# Patient Record
Sex: Female | Born: 1960
Health system: Southern US, Community
[De-identification: ages and names within clinical notes are randomized; demographics above are authoritative.]

## PROBLEM LIST (undated history)

## (undated) DIAGNOSIS — M1990 Unspecified osteoarthritis, unspecified site: Secondary | ICD-10-CM

## (undated) DIAGNOSIS — F419 Anxiety disorder, unspecified: Secondary | ICD-10-CM

## (undated) DIAGNOSIS — I10 Essential (primary) hypertension: Secondary | ICD-10-CM

## (undated) HISTORY — DX: Essential (primary) hypertension: I10

## (undated) HISTORY — DX: Unspecified osteoarthritis, unspecified site: M19.90

## (undated) HISTORY — DX: Anxiety disorder, unspecified: F41.9

## (undated) HISTORY — PX: OTHER SURGICAL HISTORY: SHX169

---

## 2012-11-21 HISTORY — PX: LUMBAR FUSION: SHX111

## 2014-11-21 HISTORY — PX: ABDOMINAL HYSTERECTOMY: SHX81

## 2019-11-13 ENCOUNTER — Encounter: Payer: Self-pay | Admitting: Gastroenterology

## 2019-11-27 ENCOUNTER — Encounter (INDEPENDENT_AMBULATORY_CARE_PROVIDER_SITE_OTHER): Payer: Self-pay

## 2019-11-27 ENCOUNTER — Other Ambulatory Visit: Payer: Self-pay

## 2019-11-27 ENCOUNTER — Ambulatory Visit (AMBULATORY_SURGERY_CENTER): Payer: BC Managed Care – PPO | Admitting: *Deleted

## 2019-11-27 VITALS — Temp 97.1°F | Ht 63.5 in | Wt 130.0 lb

## 2019-11-27 DIAGNOSIS — Z1159 Encounter for screening for other viral diseases: Secondary | ICD-10-CM

## 2019-11-27 DIAGNOSIS — Z1211 Encounter for screening for malignant neoplasm of colon: Secondary | ICD-10-CM

## 2019-11-27 MED ORDER — SUPREP BOWEL PREP KIT 17.5-3.13-1.6 GM/177ML PO SOLN: 1.0000 | Freq: Once | ORAL | 0 refills | Status: AC

## 2019-11-27 NOTE — Progress Notes (Signed)
No egg or soy allergy known to patient  No issues with past sedation with any surgeries  or procedures, no intubation problems  No diet pills per patient No home 02 use per patient  No blood thinners per patient  Pt denies issues with constipation  No A fib or A flutter  EMMI video sent to pt's e mail  Suprep $15   Due to the COVID-19 pandemic we are asking patients to follow these guidelines. Please only bring one care partner. Please be aware that your care partner may wait in the car in the parking lot or if they feel like they will be too hot to wait in the car, they may wait in the lobby on the 4th floor. All care partners are required to wear a mask the entire time (we do not have any that we can provide them), they need to practice social distancing, and we will do a Covid check for all patient's and care partners when you arrive. Also we will check their temperature and your temperature. If the care partner waits in their car they need to stay in the parking lot the entire time and we will call them on their cell phone when the patient is ready for discharge so they can bring the car to the front of the building. Also all patient's will need to wear a mask into building.

## 2019-12-04 ENCOUNTER — Encounter: Payer: Self-pay | Admitting: Gastroenterology

## 2019-12-06 ENCOUNTER — Ambulatory Visit (INDEPENDENT_AMBULATORY_CARE_PROVIDER_SITE_OTHER): Payer: BC Managed Care – PPO

## 2019-12-06 ENCOUNTER — Other Ambulatory Visit: Payer: Self-pay | Admitting: Gastroenterology

## 2019-12-06 DIAGNOSIS — Z1159 Encounter for screening for other viral diseases: Secondary | ICD-10-CM

## 2019-12-09 LAB — SARS CORONAVIRUS 2 (TAT 6-24 HRS): SARS Coronavirus 2: NEGATIVE

## 2019-12-11 ENCOUNTER — Encounter: Payer: Self-pay | Admitting: Gastroenterology

## 2019-12-11 ENCOUNTER — Ambulatory Visit (AMBULATORY_SURGERY_CENTER): Payer: BC Managed Care – PPO | Admitting: Gastroenterology

## 2019-12-11 ENCOUNTER — Other Ambulatory Visit: Payer: Self-pay

## 2019-12-11 VITALS — BP 122/71 | HR 70 | Temp 98.3°F | Resp 14 | Ht 63.5 in | Wt 130.0 lb

## 2019-12-11 DIAGNOSIS — Z1211 Encounter for screening for malignant neoplasm of colon: Secondary | ICD-10-CM

## 2019-12-11 DIAGNOSIS — D120 Benign neoplasm of cecum: Secondary | ICD-10-CM

## 2019-12-11 MED ORDER — HYDROCORTISONE (PERIANAL) 2.5 % EX CREA: 1.0000 "application " | TOPICAL_CREAM | Freq: Two times a day (BID) | CUTANEOUS | 2 refills | Status: AC | PRN

## 2019-12-11 MED ORDER — SODIUM CHLORIDE 0.9 % IV SOLN: 500.0000 mL | Freq: Once | INTRAVENOUS | Status: DC

## 2019-12-11 NOTE — Progress Notes (Signed)
Temp  LC  VS  DT  Pt's states no medical or surgical changes since previsit or office visit.    

## 2019-12-11 NOTE — Patient Instructions (Signed)
YOU HAD AN ENDOSCOPIC PROCEDURE TODAY AT [facility]:   Refer to the procedure report that was given to you for any specific questions about what was found during the examination.  If the procedure report does not answer your questions, please call your gastroenterologist to clarify.  If you requested that your care partner not be given the details of your procedure findings, then the procedure report has been included in a sealed envelope for you to review at your convenience later.  **Handouts given on polyps, hemorrhoids and diverticulosis**  YOU SHOULD EXPECT: Some feelings of bloating in the abdomen. Passage of more gas than usual.  Walking can help get rid of the air that was put into your GI tract during the procedure and reduce the bloating. If you had a lower endoscopy (such as a colonoscopy or flexible sigmoidoscopy) you may notice spotting of blood in your stool or on the toilet paper. If you underwent a bowel prep for your procedure, you may not have a normal bowel movement for a few days.  Please Note:  You might notice some irritation and congestion in your nose or some drainage.  This is from the oxygen used during your procedure.  There is no need for concern and it should clear up in a day or so.  SYMPTOMS TO REPORT IMMEDIATELY:   Following lower endoscopy (colonoscopy or flexible sigmoidoscopy):  Excessive amounts of blood in the stool  Significant tenderness or worsening of abdominal pains  Swelling of the abdomen that is new, acute  Fever of 100F or higher    For urgent or emergent issues, a gastroenterologist can be reached at any hour by calling [phone].   DIET:  We do recommend a small meal at first, but then you may proceed to your regular diet.  Drink plenty of fluids but you should avoid alcoholic beverages for 24 hours.  ACTIVITY:  You should plan to take it easy for the rest of today and you should NOT DRIVE or use heavy machinery until  tomorrow (because of the sedation medicines used during the test).    FOLLOW UP: Our staff will call the number listed on your records 48-72 hours following your procedure to check on you and address any questions or concerns that you may have regarding the information given to you following your procedure. If we do not reach you, we will leave a message.  We will attempt to reach you two times.  During this call, we will ask if you have developed any symptoms of COVID 19. If you develop any symptoms (ie: fever, flu-like symptoms, shortness of breath, cough etc.) before then, please call [phone].  If you test positive for Covid 19 in the 2 weeks post procedure, please call and report this information to [place].    If any biopsies were taken you will be contacted by phone or by letter within the next 1-3 weeks.  Please call us at [phone] if you have not heard about the biopsies in 3 weeks.    SIGNATURES/CONFIDENTIALITY: You and/or your care partner have signed paperwork which will be entered into your electronic medical record.  These signatures attest to the fact that that the information above on your After Visit Summary has been reviewed and is understood.  Full responsibility of the confidentiality of this discharge information lies with you and/or your care-partner.

## 2019-12-11 NOTE — Progress Notes (Signed)
Called to room to assist during endoscopic procedure.  Patient ID and intended procedure confirmed with present staff. Received instructions for my participation in the procedure from the performing physician.  

## 2019-12-11 NOTE — Op Note (Signed)
[facility] Patient Name: Renee Reeves Procedure Date: 12/11/2019 9:30 AM MRN: [id number] Endoscopist: Jackquline Denmark , MD Age: 59 Referring MD:  Date of Birth: Jan 30, 1961 Gender: Female Account #: [id number] Procedure:                Colonoscopy Indications:              Screening for colorectal malignant neoplasm Medicines:                Monitored Anesthesia Care Procedure:                Pre-Anesthesia Assessment:                           - Prior to the procedure, a History and Physical                            was performed, and patient medications and                            allergies were reviewed. The patient's tolerance of                            previous anesthesia was also reviewed. The risks                            and benefits of the procedure and the sedation                            options and risks were discussed with the patient.                            All questions were answered, and informed consent                            was obtained. Prior Anticoagulants: The patient has                            taken no previous anticoagulant or antiplatelet                            agents. ASA Grade Assessment: II - A patient with                            mild systemic disease. After reviewing the risks                            and benefits, the patient was deemed in                            satisfactory condition to undergo the procedure.                           After obtaining informed consent, the colonoscope  was passed under direct vision. Throughout the                            procedure, the patient's blood pressure, pulse, and                            oxygen saturations were monitored continuously. The                            Colonoscope was introduced through the anus and                            advanced to the 2 cm into the ileum. The                            colonoscopy was performed  without difficulty. The                            patient tolerated the procedure well. The quality                            of the bowel preparation was good. The terminal                            ileum, ileocecal valve, appendiceal orifice, and                            rectum were photographed. Scope In: 9:37:21 AM Scope Out: 9:56:55 AM Scope Withdrawal Time: 0 hours 13 minutes 20 seconds  Total Procedure Duration: 0 hours 19 minutes 34 seconds  Findings:                 A 10 mm polyp was found in the cecum. The polyp was                            sessile. The polyp was removed with a hot snare.                            Resection and retrieval were complete. Estimated                            blood loss: none.                           A few small-mouthed diverticula were found in the                            sigmoid colon and ascending colon.                           Non-bleeding internal hemorrhoids were found during                            retroflexion. The hemorrhoids were small.  The terminal ileum appeared normal.                           The exam was otherwise without abnormality on                            direct and retroflexion views. Complications:            No immediate complications. Estimated Blood Loss:     Estimated blood loss: none. Impression:               -Colonic polyp s/p polypectomy.                           -Mild pancolonic diverticulosis                           -Non-bleeding internal hemorrhoids.                           -Otherwise normal colonoscopy to TI. Recommendation:           - Patient has a contact number available for                            emergencies. The signs and symptoms of potential                            delayed complications were discussed with the                            patient. Return to normal activities tomorrow.                            Written discharge instructions were  provided to the                            patient.                           - Resume previous diet.                           - Continue present medications.                           - Await pathology results.                           - Repeat colonoscopy for surveillance based on                            pathology results.                           - HC 2.5% cream PR bid prn for hemorrhoids x 10  days, 2 refills.                           - No aspirin, ibuprofen, naproxen, or other                            non-steroidal anti-inflammatory drugs for 5 days                            after polyp removal.                           - The findings and recommendations were discussed                            with the designated responsible adult. Jackquline Denmark, MD 12/11/2019 10:02:35 AM This report has been signed electronically.

## 2019-12-11 NOTE — Progress Notes (Signed)
Pt tolerated well. VSS. Awake and to recovery. 

## 2019-12-13 ENCOUNTER — Telehealth: Payer: Self-pay | Admitting: *Deleted

## 2019-12-13 NOTE — Telephone Encounter (Signed)
  Follow up Call-  Call back number 12/11/2019  Post procedure Call Back phone  # [phone]  Permission to leave phone message Yes     Patient questions:  Do you have a fever, pain , or abdominal swelling? No. Pain Score  0 *  Have you tolerated food without any problems? Yes.    Have you been able to return to your normal activities? Yes.    Do you have any questions about your discharge instructions: Diet   No. Medications  No. Follow up visit  No.  Do you have questions or concerns about your Care? No.  Actions: * If pain score is 4 or above: No action needed, pain <4.   1. Have you developed a fever since your procedure? no  2.   Have you had an respiratory symptoms (SOB or cough) since your procedure? no  3.   Have you tested positive for COVID 19 since your procedure no  4.   Have you had any family members/close contacts diagnosed with the COVID 19 since your procedure?  no   If yes to any of these questions please route to Joylene John, RN and Alphonsa Gin, Therapist, sports.

## 2019-12-17 ENCOUNTER — Encounter: Payer: Self-pay | Admitting: Gastroenterology

## 2020-04-17 ENCOUNTER — Telehealth: Payer: Self-pay | Admitting: Gastroenterology

## 2020-04-17 NOTE — Telephone Encounter (Signed)
Hi Renee Reeves, Renee Reeves's PCP just called requesting Renee Reeves to be seen sooner as her scheduled appt with Dr. Lyndel Safe on 6/24. They stated that Renee Reeves has been experiencing diarrhea for the past 3 weeks, they did several stool cultures that came back negative. Renee Reeves has been taking imodium and probiotics but nothing has helped. Renee Reeves can be contacted directly for appt.

## 2020-04-17 NOTE — Telephone Encounter (Signed)
Spoke with patient.  Advised of Dr Leland Her instructions.  Pt is very concerned and worried.  She states all of this began after her covid.  Pt states she has a low grade fever and feels lightheaded at times.  I told her if she begins to feel worse after trying what Dr Lyndel Safe has instructed then go to the ED.

## 2020-04-17 NOTE — Telephone Encounter (Signed)
NP Lets give her a trial of Imodium AD 1 tablet p.o. 3 times daily with meals x 1 week, then as needed. According to the note her stool studies were negative Start probiotics 1/day x 2 weeks. Let us know how she is next week  RG

## 2020-04-27 ENCOUNTER — Telehealth: Payer: Self-pay | Admitting: Gastroenterology

## 2020-04-27 NOTE — Telephone Encounter (Signed)
Called patient back regarding continued diarrhea.  Per Dr. Bryan Lemma try Benefiber or Citrucel to bulk up stools. Try eating a high fiber diet and continue Imodium as needed.  Patient verbalized understanding.

## 2020-05-05 ENCOUNTER — Encounter: Payer: Self-pay | Admitting: Gastroenterology

## 2020-05-05 ENCOUNTER — Ambulatory Visit: Payer: BC Managed Care – PPO | Admitting: Gastroenterology

## 2020-05-05 ENCOUNTER — Other Ambulatory Visit: Payer: Self-pay

## 2020-05-05 ENCOUNTER — Emergency Department (HOSPITAL_BASED_OUTPATIENT_CLINIC_OR_DEPARTMENT_OTHER)
Admission: EM | Admit: 2020-05-05 | Discharge: 2020-05-05 | Disposition: A | Discharge status: Home / Self Care | Payer: BC Managed Care – PPO | Attending: Emergency Medicine | Admitting: Emergency Medicine

## 2020-05-05 ENCOUNTER — Encounter (HOSPITAL_BASED_OUTPATIENT_CLINIC_OR_DEPARTMENT_OTHER): Payer: Self-pay

## 2020-05-05 VITALS — BP 92/58 | HR 90 | Temp 97.8°F | Ht 63.5 in | Wt 115.1 lb

## 2020-05-05 DIAGNOSIS — R197 Diarrhea, unspecified: Secondary | ICD-10-CM | POA: Diagnosis not present

## 2020-05-05 DIAGNOSIS — I10 Essential (primary) hypertension: Secondary | ICD-10-CM | POA: Diagnosis not present

## 2020-05-05 DIAGNOSIS — R079 Chest pain, unspecified: Secondary | ICD-10-CM | POA: Insufficient documentation

## 2020-05-05 DIAGNOSIS — E876 Hypokalemia: Secondary | ICD-10-CM | POA: Diagnosis not present

## 2020-05-05 DIAGNOSIS — Z880 Allergy status to penicillin: Secondary | ICD-10-CM | POA: Diagnosis not present

## 2020-05-05 DIAGNOSIS — R531 Weakness: Secondary | ICD-10-CM | POA: Diagnosis present

## 2020-05-05 DIAGNOSIS — R630 Anorexia: Secondary | ICD-10-CM | POA: Insufficient documentation

## 2020-05-05 DIAGNOSIS — R109 Unspecified abdominal pain: Secondary | ICD-10-CM | POA: Insufficient documentation

## 2020-05-05 DIAGNOSIS — R5383 Other fatigue: Secondary | ICD-10-CM | POA: Diagnosis not present

## 2020-05-05 DIAGNOSIS — E860 Dehydration: Secondary | ICD-10-CM | POA: Insufficient documentation

## 2020-05-05 DIAGNOSIS — R110 Nausea: Secondary | ICD-10-CM | POA: Diagnosis not present

## 2020-05-05 DIAGNOSIS — R1310 Dysphagia, unspecified: Secondary | ICD-10-CM

## 2020-05-05 DIAGNOSIS — R1084 Generalized abdominal pain: Secondary | ICD-10-CM

## 2020-05-05 DIAGNOSIS — M549 Dorsalgia, unspecified: Secondary | ICD-10-CM | POA: Diagnosis not present

## 2020-05-05 DIAGNOSIS — R112 Nausea with vomiting, unspecified: Secondary | ICD-10-CM

## 2020-05-05 DIAGNOSIS — Z87891 Personal history of nicotine dependence: Secondary | ICD-10-CM | POA: Diagnosis not present

## 2020-05-05 LAB — CBC WITH DIFFERENTIAL/PLATELET
Abs Immature Granulocytes: 0.05 10*3/uL (ref 0.00–0.07)
Basophils Absolute: 0.1 10*3/uL (ref 0.0–0.1)
Basophils Relative: 1 %
Eosinophils Absolute: 0 10*3/uL (ref 0.0–0.5)
Eosinophils Relative: 0 %
HCT: 39.9 % (ref 36.0–46.0)
Hemoglobin: 13.1 g/dL (ref 12.0–15.0)
Immature Granulocytes: 1 %
Lymphocytes Relative: 25 %
Lymphs Abs: 1.8 10*3/uL (ref 0.7–4.0)
MCH: 28.5 pg (ref 26.0–34.0)
MCHC: 32.8 g/dL (ref 30.0–36.0)
MCV: 86.9 fL (ref 80.0–100.0)
Monocytes Absolute: 1.2 10*3/uL — ABNORMAL HIGH (ref 0.1–1.0)
Monocytes Relative: 17 %
Neutro Abs: 4.1 10*3/uL (ref 1.7–7.7)
Neutrophils Relative %: 56 %
Platelets: 433 10*3/uL — ABNORMAL HIGH (ref 150–400)
RBC: 4.59 MIL/uL (ref 3.87–5.11)
RDW: 12.7 % (ref 11.5–15.5)
WBC: 7.3 10*3/uL (ref 4.0–10.5)
nRBC: 0 % (ref 0.0–0.2)

## 2020-05-05 LAB — COMPREHENSIVE METABOLIC PANEL
ALT: 14 U/L (ref 0–44)
AST: 19 U/L (ref 15–41)
Albumin: 3.6 g/dL (ref 3.5–5.0)
Alkaline Phosphatase: 73 U/L (ref 38–126)
Anion gap: 18 — ABNORMAL HIGH (ref 5–15)
BUN: 24 mg/dL — ABNORMAL HIGH (ref 6–20)
CO2: 26 mmol/L (ref 22–32)
Calcium: 8.3 mg/dL — ABNORMAL LOW (ref 8.9–10.3)
Chloride: 91 mmol/L — ABNORMAL LOW (ref 98–111)
Creatinine, Ser: 1.98 mg/dL — ABNORMAL HIGH (ref 0.44–1.00)
GFR calc Af Amer: 32 mL/min — ABNORMAL LOW (ref >60)
GFR calc non Af Amer: 27 mL/min — ABNORMAL LOW (ref >60)
Glucose, Bld: 94 mg/dL (ref 70–99)
Potassium: 3 mmol/L — ABNORMAL LOW (ref 3.5–5.1)
Sodium: 135 mmol/L (ref 135–145)
Total Bilirubin: 0.7 mg/dL (ref 0.3–1.2)
Total Protein: 7 g/dL (ref 6.5–8.1)

## 2020-05-05 LAB — C-REACTIVE PROTEIN: CRP: 7.9 mg/dL — ABNORMAL HIGH (ref <1.0)

## 2020-05-05 LAB — C DIFFICILE QUICK SCREEN W PCR REFLEX
C Diff antigen: NEGATIVE
C Diff interpretation: NOT DETECTED
C Diff toxin: NEGATIVE

## 2020-05-05 LAB — SEDIMENTATION RATE: Sed Rate: 53 mm/hr — ABNORMAL HIGH (ref 0–22)

## 2020-05-05 MED ORDER — SODIUM CHLORIDE 0.9 % IV SOLN
INTRAVENOUS | Status: DC | PRN
  Administered 2020-05-05: 250 mL via INTRAVENOUS

## 2020-05-05 MED ORDER — POTASSIUM CHLORIDE ER 10 MEQ PO TBCR
10.0000 meq | EXTENDED_RELEASE_TABLET | Freq: Two times a day (BID) | ORAL | 0 refills | Status: DC
Start: 2020-05-05 — End: 2022-04-15

## 2020-05-05 MED ORDER — LACTATED RINGERS IV BOLUS
1000.0000 mL | Freq: Once | INTRAVENOUS | Status: AC
  Administered 2020-05-05: 1000 mL via INTRAVENOUS

## 2020-05-05 MED ORDER — POTASSIUM CHLORIDE 10 MEQ/100ML IV SOLN
10.0000 meq | Freq: Once | INTRAVENOUS | Status: AC
  Administered 2020-05-05: 10 meq via INTRAVENOUS
  Filled 2020-05-05: qty 100

## 2020-05-05 MED ORDER — DICYCLOMINE HCL 10 MG PO CAPS: 10.0000 mg | ORAL_CAPSULE | Freq: Three times a day (TID) | ORAL | 0 refills | Status: DC

## 2020-05-05 NOTE — Progress Notes (Signed)
Chief Complaint:   Referring Provider:  Myrlene Broker, MD      ASSESSMENT AND PLAN;   #1. N/V/dysphagia  #2. Gen abdo pain  #3. Diarrhea. Neg colon to TI except for colonic polyp Jan 2021. CT AP 04/2020 showing dilated small bowel and colonic loops without obstruction. Neg stool studies for C. Difficile.  No response to empiric Flagyl.  No response to Lomotil/Imodium. Not keen on stopping Leflunomide which can cause diarrhea.  Plan: -Please obtain previous records  -Send to ED downstairs- IVF, labs, further eval -CBC, CMP, CRP, sed rate -Stool studies for GI Pathogen (includes C. Diff), WBCs, culture,O&P, fecal elastase, fat and Calprotectin. - Depending upon above-I think we should go ahead with UGI series with SBFT (with Ba tab as well) followed by EGD with esophageal dilatation if needed. -Also would recommend trial of Bentyl -Continue Zofran for now. -Await ED visit results. -Push PO fluids. -Discussed extensively with the patient and patient's husband. -CT report from 04/21/2020 was reviewed.   HPI:    Renee Reeves is a 59 y.o. 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  Got progressively worse.  Seen by Dr. Unk Lightning.  Had negative stool studies.  Treated empirically with metronidazole x 1 week without any benefit.  Seen in ED on 04/21/2020.  Afebrile, vitals were stable including blood pressure.  Had normal CBC with hemoglobin 12.4, WBC count 5.1, BMP with BUN 11/creatinine 0.6.  Underwent CT Abdo/pelvis with contrast showing " multiple nondilated fluid-filled large and small bowel loops throughout the abdomen.  No obstruction.  Mild intrahepatic biliary ductal dilatation-nonspecific.  Correlate with LFTs.".  I did not see LFTs in ED note per se.  Lab reports are awaited.  Stool studies were sent from ED.  According to the notes patient was negative for C. difficile.  It was suggested to start Cipro  plus Flagyl.  Add Lomotil as needed.  And make follow-up appointment with GI and rheumatology.  She did not take Cipro or Flagyl.  She did take Lomotil which made her symptoms worse.  Currently having diarrhea 2-3 times per day, after eating, no nocturnal symptoms.  Has some urgency.  Crampy abdominal pain which gets better with defecation.  Also abdominal bloating which gets better with defecation.  -C/O nausea/vomiting x similar duration.  Mostly happens with solids.  She also has been complaining of dysphagia without odynophagia with food especially pills getting hung up in the mid to upper chest.  She told me that she cannot swallow any pills or eat food.  Not having any significant heartburn.  Has Zofran which helps a little.  Feels dizzy lightheaded and would like to have IV fluids.  - C/O generalized abdominal pain with abdominal bloating-moderate to severe, crampy, gets better with bowel movements.  Gets worse after eating.  -Lost 15lb over 1 month.  Had subjective fevers/chills but no fever x last 2 nights. Drinking ensure   Has seen rheumatology for rheumatoid arthritis.  She has been recommended to go off [place].  But she is very reluctant to do that.    Has taken herself off Celebrex.  However, that is resulted in generalized back pain as well.  Does admit that anxiety has some role to play.  She wanted to record the conversation.  I have let her.   Past GI procedures: -Colonoscopy 12/11/2019 (part of colorectal cancer screening).  Advanced to TI.  1 cm tubular adenoma s/p polypectomy, mild pancolonic diverticulosis.  Otherwise normal colonoscopy.  Repeat in 3 years.  Past Medical History:  Diagnosis Date  . Anxiety    OCC  . Arthritis    RA  . Hypertension     Past Surgical History:  Procedure Laterality Date  . ABDOMINAL HYSTERECTOMY  2016  . gynecological procedure    . LUMBAR FUSION  2014    Family History  Problem Relation Age of Onset  . Colon polyps Neg Hx    . Colon cancer Neg Hx   . Esophageal cancer Neg Hx   . Rectal cancer Neg Hx   . Stomach cancer Neg Hx   . Pancreatic cancer Neg Hx     Social History   Tobacco Use  . Smoking status: Former Research scientist (life sciences)  . Smokeless tobacco: Never Used  . Tobacco comment: stopped age 34   Vaping Use  . Vaping Use: Never used  Substance Use Topics  . Alcohol use: Yes    Comment: occ wine   . Drug use: Never    Current Outpatient Medications  Medication Sig Dispense Refill  . acetaminophen (TYLENOL) 500 MG tablet Take 500 mg by mouth every 6 (six) hours as needed. (Patient not taking: Reported on 05/05/2020)    . ALPRAZolam (XANAX) 1 MG tablet alprazolam 1 mg tablet (Patient not taking: Reported on 05/05/2020)    . amLODipine-valsartan (EXFORGE) 10-320 MG tablet amlodipine 10 mg-valsartan 320 mg tablet (Patient not taking: Reported on 05/05/2020)    . Calcium Carb-Cholecalciferol 500-400 MG-UNIT CHEW Chew by mouth. (Patient not taking: Reported on 05/05/2020)    . celecoxib (CELEBREX) 200 MG capsule SMARTSIG:1 Capsule(s) By Mouth 1 to 2 Times Daily (Patient not taking: Reported on 05/05/2020)    . Cholecalciferol 1.25 MG (50000 UT) TABS Take by mouth. (Patient not taking: Reported on 05/05/2020)    . famotidine (PEPCID) 40 MG tablet Take by mouth. (Patient not taking: Reported on 05/05/2020)    . folic acid (FOLVITE) 1 MG tablet folic acid 1 mg tablet (Patient not taking: Reported on 05/05/2020)    . leflunomide (ARAVA) 20 MG tablet leflunomide 20 mg tablet (Patient not taking: Reported on 05/05/2020)    . tiZANidine (ZANAFLEX) 4 MG tablet Take by mouth. (Patient not taking: Reported on 05/05/2020)    . valACYclovir (VALTREX) 500 MG tablet valacyclovir 500 mg tablet (Patient not taking: Reported on 05/05/2020)     No current facility-administered medications for this visit.    Allergies  Allergen Reactions  . Hydrocodone Itching  . Lisinopril Itching and Rash  . Penicillins Itching and Other (See Comments)      As a child As a child     Review of Systems:  Constitutional: has fever, chills, diaphoresis, has appetite change and extreme fatigue.  HEENT: Denies photophobia, eye pain, redness, hearing loss, ear pain, congestion, sore throat, rhinorrhea, sneezing, mouth sores, neck pain, neck stiffness and tinnitus.   Respiratory: Denies SOB, DOE, cough, chest tightness,  and wheezing.   Cardiovascular: Denies chest pain, palpitations and leg swelling.  Genitourinary: Denies dysuria, urgency, frequency, hematuria, flank pain and difficulty urinating.  Musculoskeletal: has myalgias, back pain, joint swelling, arthralgias and gait problem.  Skin: No rash.  Neurological: Denies dizziness, seizures, syncope, weakness, light-headedness, numbness and has headaches.  Hematological: Denies adenopathy. Easy bruising, personal or family bleeding history  Psychiatric/Behavioral:has anxiety or depression     Physical Exam:    BP (!) 92/58   Pulse 90  Temp 97.8 F (36.6 C)   Ht 5' 3.5" (1.613 m)   Wt 115 lb 2 oz (52.2 kg)   BMI 20.07 kg/m  Wt Readings from Last 3 Encounters:  05/05/20 115 lb 2 oz (52.2 kg)  12/11/19 130 lb (59 kg)  11/27/19 130 lb (59 kg)   Constitutional: Thin built white female.  Accompanied by her husband.  Very anxious. Psychiatric: Normal mood and affect. Behavior is normal. HEENT: Pupils normal.  Conjunctivae are normal. No scleral icterus. Neck supple.  Cardiovascular: Normal rate, regular rhythm. No edema Pulmonary/chest: Effort normal and breath sounds normal. No wheezing, rales or rhonchi. Abdominal: Soft, nondistended. Nontender. Bowel sounds active throughout. There are no masses palpable. No hepatomegaly. Rectal:  defered Neurological: Alert and oriented to person place and time. Skin: Skin is warm and dry. No rashes noted.   Carmell Austria, MD 05/05/2020, 3:15 PM  Cc: Renee Broker, MD

## 2020-05-05 NOTE — ED Provider Notes (Signed)
[facility] EMERGENCY DEPARTMENT Provider Note   CSN: [id number] Arrival date & time: 05/05/20  1609     History Chief Complaint  Patient presents with  . Weakness    Renee Reeves is a 59 y.o. female.  HPI Patient presents with over 5 weeks of diarrhea.  Had some nausea.  Feels the food gets caught in her chest.  Has had CT scan which showed an enterocolitis around 2 weeks ago.  Seen by Dr. Lyndel Safe at [facility] gastroenterology today and sent in for further work-up.  Requested labs put in.  Also likely needs IV fluids.  Has still abdominal pain.  Also mid chest pain.  Has a history of rheumatoid arthritis and has been on immunosuppression for.  Symptoms began after getting her second Covid vaccination.  Also was lost around 15 pounds over this time.    Past Medical History:  Diagnosis Date  . Anxiety    OCC  . Arthritis    RA  . Hypertension     There are no problems to display for this patient.   Past Surgical History:  Procedure Laterality Date  . ABDOMINAL HYSTERECTOMY  2016  . gynecological procedure    . LUMBAR FUSION  2014     OB History   No obstetric history on file.     Family History  Problem Relation Age of Onset  . Colon polyps Neg Hx   . Colon cancer Neg Hx   . Esophageal cancer Neg Hx   . Rectal cancer Neg Hx   . Stomach cancer Neg Hx   . Pancreatic cancer Neg Hx     Social History   Tobacco Use  . Smoking status: Former Research scientist (life sciences)  . Smokeless tobacco: Never Used  . Tobacco comment: stopped age 24   Vaping Use  . Vaping Use: Never used  Substance Use Topics  . Alcohol use: Yes    Comment: occ wine   . Drug use: Never    Home Medications Prior to Admission medications   Medication Sig Start Date End Date Taking? Authorizing Provider  acetaminophen (TYLENOL) 500 MG tablet Take 500 mg by mouth every 6 (six) hours as needed. Patient not taking: Reported on 05/05/2020    [provider]  ALPRAZolam Duanne Moron) 1 MG tablet  alprazolam 1 mg tablet Patient not taking: Reported on 05/05/2020 09/17/19   [provider]  amLODipine-valsartan (EXFORGE) 10-320 MG tablet amlodipine 10 mg-valsartan 320 mg tablet Patient not taking: Reported on 05/05/2020 09/17/19   [provider]  Calcium Carb-Cholecalciferol 500-400 MG-UNIT CHEW Chew by mouth. Patient not taking: Reported on 05/05/2020    [provider]  celecoxib (CELEBREX) 200 MG capsule SMARTSIG:1 Capsule(s) By Mouth 1 to 2 Times Daily Patient not taking: Reported on 05/05/2020 11/07/19   [provider]  Cholecalciferol 1.25 MG (50000 UT) TABS Take by mouth. Patient not taking: Reported on 05/05/2020    [provider]  dicyclomine (BENTYL) 10 MG capsule Take 1 capsule (10 mg total) by mouth 4 (four) times daily -  before meals and at bedtime. 05/05/20   Davonna Belling, MD  famotidine (PEPCID) 40 MG tablet Take by mouth. Patient not taking: Reported on 05/05/2020 07/19/19   [provider]  folic acid (FOLVITE) 1 MG tablet folic acid 1 mg tablet Patient not taking: Reported on 05/05/2020 08/01/15   [provider]  leflunomide (ARAVA) 20 MG tablet leflunomide 20 mg tablet Patient not taking: Reported on 05/05/2020 08/01/15   [provider]  potassium chloride (KLOR-CON) 10 MEQ tablet Take 1 tablet (10 mEq total) by mouth 2 (two) times daily. 05/05/20   Davonna Belling, MD  tiZANidine (ZANAFLEX) 4 MG tablet Take by mouth. Patient not taking: Reported on 05/05/2020 09/17/19   [provider]  valACYclovir (VALTREX) 500 MG tablet valacyclovir 500 mg tablet Patient not taking: Reported on 05/05/2020 08/01/15   [provider]    Allergies    Hydrocodone, Lisinopril, and Penicillins  Review of Systems   Review of Systems  Constitutional: Positive for appetite change, fatigue and unexpected weight change.  HENT: Negative for congestion.   Respiratory: Negative for shortness of breath.    Cardiovascular: Negative for chest pain.  Gastrointestinal: Positive for diarrhea.  Genitourinary: Negative for frequency.  Musculoskeletal: Positive for back pain.  Skin: Negative for rash.  Neurological: Positive for weakness.  Psychiatric/Behavioral: Negative for confusion.    Physical Exam Updated Vital Signs BP 116/74 (BP Location: Right Arm)   Pulse 84   Temp 99.2 F (37.3 C) (Oral)   Resp 16   Ht 5\' 3"  (1.6 m)   Wt 52.2 kg   SpO2 100%   BMI 20.39 kg/m   Physical Exam Vitals and nursing note reviewed.  HENT:     Head: Normocephalic.     Mouth/Throat:     Mouth: Mucous membranes are moist.  Eyes:     Pupils: Pupils are equal, round, and reactive to light.  Cardiovascular:     Rate and Rhythm: Normal rate.  Pulmonary:     Breath sounds: No wheezing or rhonchi.  Abdominal:     Tenderness: There is no abdominal tenderness.  Musculoskeletal:     Right lower leg: No edema.     Left lower leg: No edema.  Skin:    General: Skin is warm.     Capillary Refill: Capillary refill takes less than 2 seconds.  Neurological:     Mental Status: She is alert and oriented to person, place, and time.     ED Results / Procedures / Treatments   Labs (all labs ordered are listed, but only abnormal results are displayed) Labs Reviewed  CBC WITH DIFFERENTIAL/PLATELET - Abnormal; Notable for the following components:      Result Value   Platelets 433 (*)    Monocytes Absolute 1.2 (*)    All other components within normal limits  COMPREHENSIVE METABOLIC PANEL - Abnormal; Notable for the following components:   Potassium 3.0 (*)    Chloride 91 (*)    BUN 24 (*)    Creatinine, Ser 1.98 (*)    Calcium 8.3 (*)    GFR calc non Af Amer 27 (*)    GFR calc Af Amer 32 (*)    Anion gap 18 (*)    All other components within normal limits  SEDIMENTATION RATE - Abnormal; Notable for the following components:   Sed Rate 53 (*)    All other components within normal limits   GASTROINTESTINAL PANEL BY PCR, STOOL (REPLACES STOOL CULTURE)  C DIFFICILE QUICK SCREEN W PCR REFLEX  STOOL CULTURE  CALPROTECTIN, FECAL  OVA + PARASITE EXAM  C-REACTIVE PROTEIN  FECAL FAT, QUALITATIVE  LACTOFERRIN, FECAL,QUALITATIVE  PANCREATIC ELASTASE, FECAL    EKG EKG Interpretation  Date/Time:  Tuesday May 05 2020 16:17:50 EDT Ventricular Rate:  68 PR Interval:    QRS Duration: 96 QT Interval:  445 QTC Calculation: 474 R Axis:   78 Text Interpretation: Sinus rhythm Consider left atrial  enlargement Confirmed by Davonna Belling [id number]) on 05/05/2020 5:29:34 PM   Radiology No results found.  Procedures Procedures (including critical care time)  Medications Ordered in ED Medications  0.9 %  sodium chloride infusion (250 mLs Intravenous New Bag/Given 05/05/20 1939)  lactated ringers bolus 1,000 mL (0 mLs Intravenous Stopped 05/05/20 1809)  potassium chloride 10 mEq in 100 mL IVPB (0 mEq Intravenous Stopped 05/05/20 2130)  lactated ringers bolus 1,000 mL (0 mLs Intravenous Stopped 05/05/20 2130)    ED Course  I have reviewed the triage vital signs and the nursing notes.  Pertinent labs & imaging results that were available during my care of the patient were reviewed by me and considered in my medical decision making (see chart for details).    MDM Rules/Calculators/A&P                          Patient presents with diarrhea.  Has had over the last 5 to 6 weeks.  Has had previous overall reassuring CT scan.  Sent in by GI.  Feels much better after IV fluids.  Second liter made her feel better.  However does have a kidney injury since 2 weeks ago.  Creatinine now up to almost 2 when it was normal before.  Also mild hypokalemia.  Patient feels better and is tolerating orals.  I think she can be managed as an outpatient.  Has a extensive blood in stool testing done which can be followed by her gastroenterologist.  Will not repeat imaging at this time.  Blood pressure  improved.  Discharge home. Final Clinical Impression(s) / ED Diagnoses Final diagnoses:  Dehydration  Hypokalemia  Diarrhea, unspecified type    Rx / DC Orders ED Discharge Orders         Ordered    potassium chloride (KLOR-CON) 10 MEQ tablet  2 times daily     Discontinue  Reprint     05/05/20 2126    dicyclomine (BENTYL) 10 MG capsule  3 times daily before meals & bedtime     Discontinue  Reprint     05/05/20 2126           Davonna Belling, MD 05/05/20 2131

## 2020-05-05 NOTE — Patient Instructions (Addendum)
If you are age 58 or older, your body mass index should be between 23-30. Your Body mass index is 20.07 kg/m. If this is out of the aforementioned range listed, please consider follow up with your Primary Care Provider.  If you are age 56 or younger, your body mass index should be between 19-25. Your Body mass index is 20.07 kg/m. If this is out of the aformentioned range listed, please consider follow up with your Primary Care Provider.   Please go to the emergency department.  IVF- please draw labs: CBC, CMP, CRP, Sedrate, Stool Studies for GI pathogen (include C Diff), WBCS, culture, O&P fecal elastase, fat and calprotectin.   Thank you for choosing me and [facility].   Jackquline Denmark, MD

## 2020-05-05 NOTE — ED Triage Notes (Signed)
Pt arrives with c/o 5 weeks of diarrhea after 2nd Covid vaccination. Pt reports she has lost 15 lbs during this time. Pt states she has felt weak and dizzy today.

## 2020-05-05 NOTE — ED Notes (Signed)
ED Provider Alvino Chapel, MD at bedside.

## 2020-05-05 NOTE — ED Notes (Signed)
Pt on monitor 

## 2020-05-05 NOTE — ED Notes (Signed)
Provider at bedside

## 2020-05-05 NOTE — ED Notes (Signed)
Pt ambulatory with steady gait to restroom to provide stool and urine specimens

## 2020-05-06 ENCOUNTER — Telehealth: Payer: Self-pay | Admitting: Gastroenterology

## 2020-05-06 LAB — GASTROINTESTINAL PANEL BY PCR, STOOL (REPLACES STOOL CULTURE)

## 2020-05-06 LAB — LACTOFERRIN, FECAL, QUALITATIVE: Lactoferrin, Fecal, Qual: POSITIVE — AB

## 2020-05-06 NOTE — Telephone Encounter (Signed)
Lets proceed with upper GI series (also give barium tablet) Bentyl 10 mg p.o. 4 times daily (half an hour before meals and bedtime) x 2 days, then decrease it to twice daily (half an hour before meals and bedtime) #120. Lets get all stool studies/labs etc as per previous note/ED visit. Push p.o. fluids  RG

## 2020-05-07 ENCOUNTER — Telehealth: Payer: Self-pay | Admitting: Gastroenterology

## 2020-05-07 LAB — CALPROTECTIN, FECAL: Calprotectin, Fecal: 466 ug/g — ABNORMAL HIGH (ref 0–120)

## 2020-05-08 ENCOUNTER — Other Ambulatory Visit: Payer: Self-pay

## 2020-05-08 DIAGNOSIS — R112 Nausea with vomiting, unspecified: Secondary | ICD-10-CM

## 2020-05-08 LAB — OVA + PARASITE EXAM

## 2020-05-08 LAB — O&P RESULT

## 2020-05-08 NOTE — Telephone Encounter (Signed)
Pt advised of Bentyl directions.  She states she was given Bentyl in the ER so therefore to hold off sending in another prescription.  We are still waiting on all lab results.  UGI series with sbft scheduled for 05/13/20 at [facility].  Pt advised to push fluids.  Pt verbalized understanding.

## 2020-05-11 LAB — STOOL CULTURE REFLEX - CMPCXR

## 2020-05-11 LAB — STOOL CULTURE: E coli, Shiga toxin Assay: NEGATIVE

## 2020-05-11 LAB — STOOL CULTURE REFLEX - RSASHR

## 2020-05-12 NOTE — Telephone Encounter (Signed)
Pt is requesting a call back from a nurse regarding her ED lab results from 6/15. Pt was informed the results were to be sent to Dr Lyndel Safe.

## 2020-05-12 NOTE — Telephone Encounter (Signed)
Patient advised that positive lactoferrin indicates inflammation or infection but we are still waiting on remaining stool test.  We will let her know as soon as test result.

## 2020-05-13 ENCOUNTER — Telehealth (HOSPITAL_BASED_OUTPATIENT_CLINIC_OR_DEPARTMENT_OTHER): Payer: Self-pay

## 2020-05-13 ENCOUNTER — Ambulatory Visit (HOSPITAL_COMMUNITY)
Admission: RE | Admit: 2020-05-13 | Discharge: 2020-05-13 | Disposition: A | Discharge status: Home / Self Care | Payer: BC Managed Care – PPO | Source: Ambulatory Visit | Attending: Gastroenterology | Admitting: Gastroenterology

## 2020-05-13 ENCOUNTER — Other Ambulatory Visit: Payer: Self-pay

## 2020-05-13 DIAGNOSIS — R112 Nausea with vomiting, unspecified: Secondary | ICD-10-CM | POA: Diagnosis not present

## 2020-05-13 LAB — PANCREATIC ELASTASE, FECAL

## 2020-05-14 ENCOUNTER — Ambulatory Visit: Payer: BC Managed Care – PPO | Admitting: Gastroenterology

## 2020-05-15 NOTE — Telephone Encounter (Signed)
Pt called looking for pending results on stools tests. Pt would like to know a time frame on when her results will be ready. Pt expressed her frustration about how long is taking for her results to come back. She states that she is not feeling well and that she has lost a lot of weight and would like some treatment. Pls call her.

## 2020-05-18 NOTE — Telephone Encounter (Signed)
Have discussed in detail with the patient over the phone.  Stool studies are consistent with inflammation-elevated WBCs and calprotectin.  Stool studies were negative for GI pathogens, cultures, C. Difficile.  Fecal elastase was negative. Blood work shows elevated CRP and sed rate. Upper GI with small bowel series-neg Colonoscopy 12/11/2019 was negative for Crohn's disease/IBD. CT Abdo/pelvis at [facility] was reviewed.  She has stopped taking Arava x 2 weeks without any improvement in diarrhea.  Continues to have 3-4 bowel movements per day especially after eating.  Loose watery in consistency.  Her nausea/vomiting/dysphagia is better.  She has lost 20 pounds over the last 3 to 4 weeks.  Her symptoms started after second Covid shot.  No fever or chills.  She denies having any palpitations/shortness of breath/chest pains or any signs/symptoms of myocarditis.  She does complain of joint pains (has history of rheumatoid arthritis)  Plan: -Since she is having systemic symptoms which are highly s/o inflammatory process ?  Etiology.  I think it will be worthwhile to give her tapering prednisone.  We will start with 40 mg p.o. once a day until diarrhea completely stops, then taper it down to 10 mg/week.  I have called in prednisone to [facility] on [address] -If she has any heartburn, will give her Protonix.  She will continue taking calcium supplements.  I have extensively discussed side effects from prednisone.  She is willing to try.  I do not think budesonide is going to be very helpful. -Check CBC, CMP, sed rate, CRP in 2 weeks. -She will call us in 1 week -If she is still not better, would consider CTE.  Thereafter, referral to tertiary care center like [facility]   RG

## 2020-05-18 NOTE — Telephone Encounter (Signed)
Pt is requesting a call back from Dr Lyndel Safe regarding her results.

## 2020-05-19 ENCOUNTER — Telehealth: Payer: Self-pay | Admitting: Gastroenterology

## 2020-05-19 NOTE — Telephone Encounter (Signed)
Patient is calling states she spoke with Dr. Lyndel Safe yesterday evening and he told he would be sending in Prednisone for her but she said [facility] does not have the scripts, Please advise she said she needs it this morning.

## 2020-05-19 NOTE — Telephone Encounter (Signed)
Pt called regarding this msg. Would like to have called in as soon as possible.

## 2020-05-20 ENCOUNTER — Other Ambulatory Visit: Payer: Self-pay

## 2020-05-20 DIAGNOSIS — R197 Diarrhea, unspecified: Secondary | ICD-10-CM

## 2020-05-20 DIAGNOSIS — R112 Nausea with vomiting, unspecified: Secondary | ICD-10-CM

## 2020-05-20 NOTE — Telephone Encounter (Signed)
Patient advised of labs that need to be drawn in 2 weeks.  Ordered entered. Patient verbalized understanding. Patient states is she to continued Bentyl while taking Prednisone.  She states she is almost out of Bentyl.  Please advise. Thanks, Peter Congo.

## 2020-05-21 ENCOUNTER — Other Ambulatory Visit: Payer: Self-pay

## 2020-05-21 MED ORDER — DICYCLOMINE HCL 10 MG PO CAPS: 10.0000 mg | ORAL_CAPSULE | Freq: Two times a day (BID) | ORAL | 6 refills | Status: DC

## 2020-05-21 NOTE — Telephone Encounter (Signed)
Please call in Bentyl 10 mg p.o. twice daily, 60, 6 refills RG

## 2020-05-21 NOTE — Telephone Encounter (Signed)
Spoke with patient.  Prescription sent to pharmacy.  Patient is to take twice daily.  Patient verbalized understanding.

## 2020-06-02 ENCOUNTER — Other Ambulatory Visit (INDEPENDENT_AMBULATORY_CARE_PROVIDER_SITE_OTHER): Payer: BC Managed Care – PPO

## 2020-06-02 DIAGNOSIS — R197 Diarrhea, unspecified: Secondary | ICD-10-CM | POA: Diagnosis not present

## 2020-06-02 LAB — CBC WITH DIFFERENTIAL/PLATELET
Basophils Absolute: 0 10*3/uL (ref 0.0–0.1)
Basophils Relative: 0.2 % (ref 0.0–3.0)
Eosinophils Absolute: 0 10*3/uL (ref 0.0–0.7)
Eosinophils Relative: 0.2 % (ref 0.0–5.0)
HCT: 38.6 % (ref 36.0–46.0)
Hemoglobin: 12.6 g/dL (ref 12.0–15.0)
Lymphocytes Relative: 12.4 % (ref 12.0–46.0)
Lymphs Abs: 1.7 10*3/uL (ref 0.7–4.0)
MCHC: 32.6 g/dL (ref 30.0–36.0)
MCV: 89.1 fl (ref 78.0–100.0)
Monocytes Absolute: 0.6 10*3/uL (ref 0.1–1.0)
Monocytes Relative: 4.5 % (ref 3.0–12.0)
Neutro Abs: 11.3 10*3/uL — ABNORMAL HIGH (ref 1.4–7.7)
Neutrophils Relative %: 82.7 % — ABNORMAL HIGH (ref 43.0–77.0)
Platelets: 266 10*3/uL (ref 150.0–400.0)
RBC: 4.33 Mil/uL (ref 3.87–5.11)
RDW: 15.7 % — ABNORMAL HIGH (ref 11.5–15.5)
WBC: 13.7 10*3/uL — ABNORMAL HIGH (ref 4.0–10.5)

## 2020-06-02 LAB — COMPREHENSIVE METABOLIC PANEL
ALT: 13 U/L (ref 0–35)
AST: 10 U/L (ref 0–37)
Albumin: 4.1 g/dL (ref 3.5–5.2)
Alkaline Phosphatase: 73 U/L (ref 39–117)
BUN: 15 mg/dL (ref 6–23)
CO2: 31 mEq/L (ref 19–32)
Calcium: 9.7 mg/dL (ref 8.4–10.5)
Chloride: 101 mEq/L (ref 96–112)
Creatinine, Ser: 0.68 mg/dL (ref 0.40–1.20)
GFR: 88.91 mL/min (ref >60.00)
Glucose, Bld: 140 mg/dL — ABNORMAL HIGH (ref 70–99)
Potassium: 3.8 mEq/L (ref 3.5–5.1)
Sodium: 140 mEq/L (ref 135–145)
Total Bilirubin: 0.4 mg/dL (ref 0.2–1.2)
Total Protein: 6.4 g/dL (ref 6.0–8.3)

## 2020-06-02 LAB — C-REACTIVE PROTEIN: CRP: <1 mg/dL (ref 0.5–20.0)

## 2020-06-02 LAB — SEDIMENTATION RATE: Sed Rate: 7 mm/hr (ref 0–30)

## 2020-06-07 ENCOUNTER — Other Ambulatory Visit: Payer: Self-pay | Admitting: Gastroenterology

## 2020-06-09 ENCOUNTER — Telehealth: Payer: Self-pay | Admitting: Gastroenterology

## 2020-06-09 NOTE — Telephone Encounter (Signed)
Spoke to patient to inform her that her records have been sent to Dr Audelia Hives office. Patient voiced understanding.

## 2020-07-01 ENCOUNTER — Telehealth: Payer: Self-pay

## 2020-07-01 NOTE — Telephone Encounter (Signed)
Spoke with patient who reports back in May 2021 having an inflammatory reaction after receiving her 2nd Covid vaccine. She states having diarrhea and Wheezing symptoms. She has had labs Imaging and medication intervention with steroids, anti diarrheals  and food modifications. Patient states that her diarrhea has improved recently with simi formed stools she continues to take bentyl twice daily now. Her CXR was normal,and her PCP felt like she had a case of bronchitis. Patient states that her symptoms are slowly improving, but wanted some reassurance. Patient was advised,since she is feeling better to monitor her symptoms for another week or 2 and call us if no improvement, for an office visit. All questions answered,patient voiced understanding.

## 2020-07-01 NOTE — Telephone Encounter (Signed)
-----   Message from Lowell Guitar, [place] sent at 07/01/2020 10:49 AM EDT ----- Regarding: continued GI problems Please call patient regarding continued GI problems # [phone]. thanks

## 2020-07-17 NOTE — Telephone Encounter (Signed)
Pls call pt, she stated to still have problems with inflammation after her Covid vaccine.

## 2020-07-17 NOTE — Telephone Encounter (Signed)
Spoke to patient who continues to have some wheezing and coughing since receiving her Covid vaccine. Her stools are not as formed as she would like and states she is going to start an OTC probiotic. Patient ws advised to call her PCP on Monday to discuss her respiratory symptom. Patient voiced understanding.

## 2020-08-07 ENCOUNTER — Telehealth: Payer: Self-pay | Admitting: Gastroenterology

## 2020-08-07 NOTE — Telephone Encounter (Signed)
Spoke to patient to inform her that it was safe to take Probiotics. If she does not notice any relief of her symptoms in 4 week then she can try a different one or discontinue use all together. Patient voiced understanding.

## 2020-08-07 NOTE — Telephone Encounter (Signed)
Patient called would like to know if she can take probiotics long term with her condition of auto immune system. Please advise

## 2020-08-28 NOTE — Telephone Encounter (Signed)
Spoke to patient who reports ongoing diarrhea. Her symptoms started in June  two days after receiving the second Pfixer Covid vaccine. She feels her symptoms are related to the vaccine. She had a CT 04/21/20 abd/pelvis with no inflammatory changes noted. Patient has had normal stool studies. She takes Bently twice daily. Imodium AD is not helpful. She is going to try Pepto Bismol as directed and switch to a different Probiotic(Culturelle for IBS) Patient felt she got the most relief while on the Prednisone taper. She reports her symptoms are not as bad when all this started. She has woken up in the middle of the night a few times  with diarrhea and goes about 3 more times during the day Dr Adonis Brook advise.

## 2020-08-28 NOTE — Telephone Encounter (Signed)
Pt is wanting to consult with a nurse to advise if she would be able to take Culturelle IBS probiotics. Pt states her symptoms are not improving.

## 2020-08-31 NOTE — Telephone Encounter (Signed)
Spoke to patient to inform her of Dr Steve Rattler recommendations to increase Bentyl to 10 mg p.o. 4 times daily half an hour before meals and bedtime x 2 weeks, then reduce it to twice daily. She will call us in 1 week to report how she is doing. All questions answered. Patient voiced understanding.

## 2020-08-31 NOTE — Telephone Encounter (Signed)
Patient with diarrhea after COVID-19 vaccine Negative CT Abdo/pelvis. Bentyl has helped. Lets increase Bentyl to 10 mg p.o. 4 times daily half an hour before meals and bedtime x 2 weeks, then reduce it to twice daily. Let us know how she is in 1 week. Should keep herself well-hydrated. RG

## 2020-11-25 ENCOUNTER — Other Ambulatory Visit: Payer: Self-pay | Admitting: Gastroenterology

## 2021-02-11 IMAGING — RF DG UGI W/ SMALL BOWEL
11 of 18 series · 13 of 24 positions shown · non-contrast
Comparison: None.

CLINICAL DATA: 58-year-old female with history of chronic nausea,
vomiting and diarrhea. Difficulty swallowing.

EXAM:
UPPER GI SERIES WITH SMALL BOWEL FOLLOW-THROUGH
FLUOROSCOPY TIME:  Fluoroscopy Time:  4 minutes and 6 seconds
Radiation Exposure Index (if provided by the fluoroscopic device):
64.9 mGy
TECHNIQUE: Combined double contrast and single contrast upper GI series using
effervescent crystals, thick barium, and thin barium. Subsequently,
serial images of the small bowel were obtained including spot views
of the terminal ileum.

[Series 1: t abdomen barium · 0.15mm/px · 1 of 1 slices shown (1 of 2)]
[im 1/1]
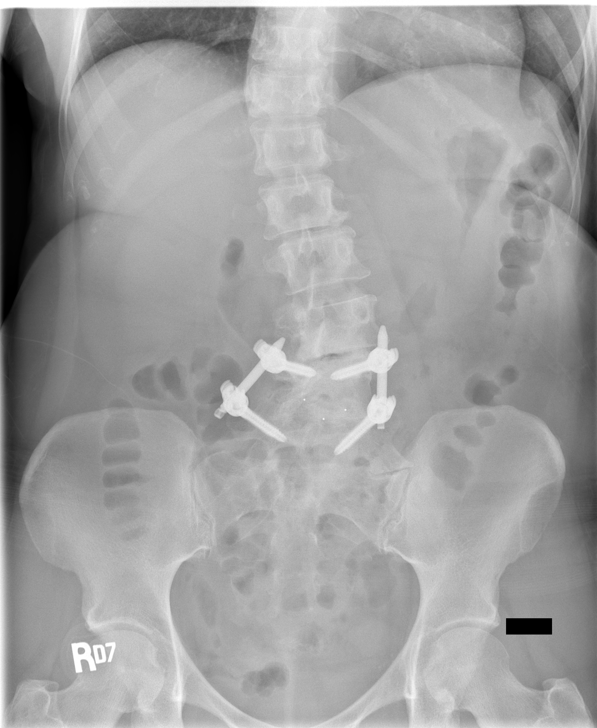

[Series 4: fluoro_barium 2fps_bw · 0.18mm/px · 1 of 1 slices shown (1 of 6)]
[im 1/1]
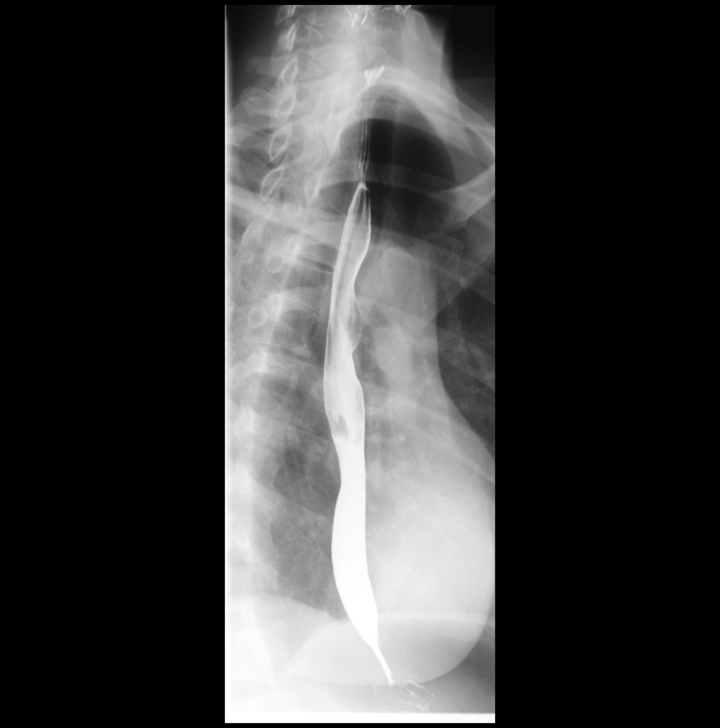

[Series 7: fluoro_barium 2fps_bw · 0.17mm/px · 1 of 1 slices shown (2 of 6)]
[im 1/1]
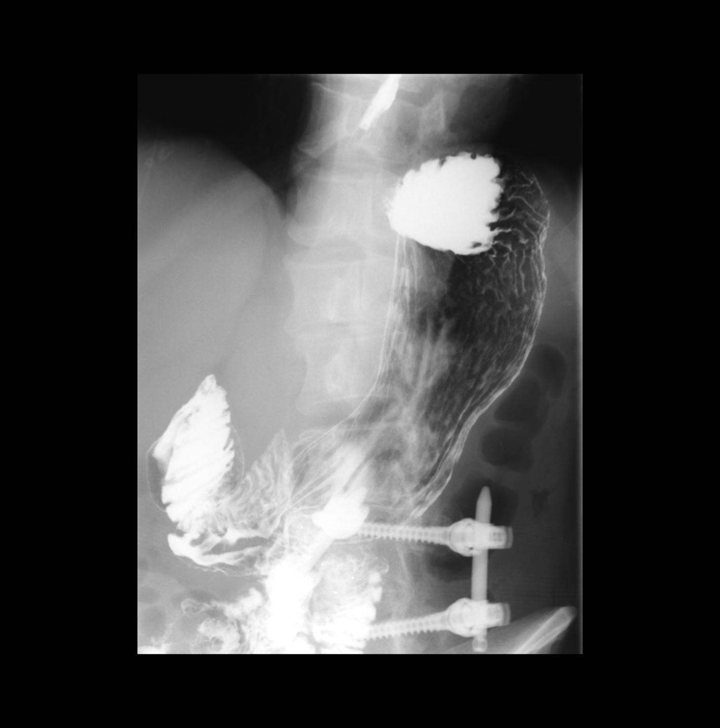

[Series 9: fluoro_barium 2fps_bw · 0.17mm/px · 1 of 1 slices shown (3 of 6)]
[im 1/1]
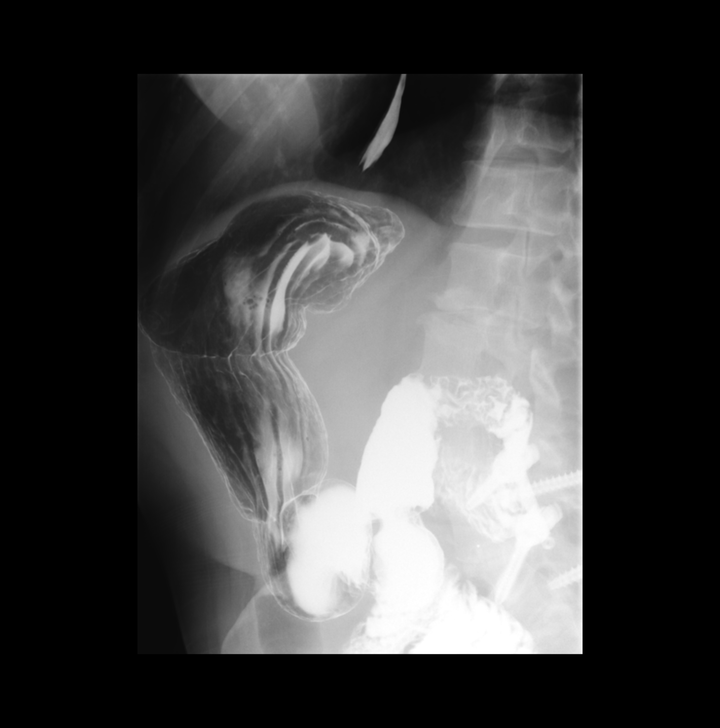

[Series 12: fluoro_barium 2fps_bw · 0.17mm/px · 1 of 1 slices shown (4 of 6)]
[im 1/1]
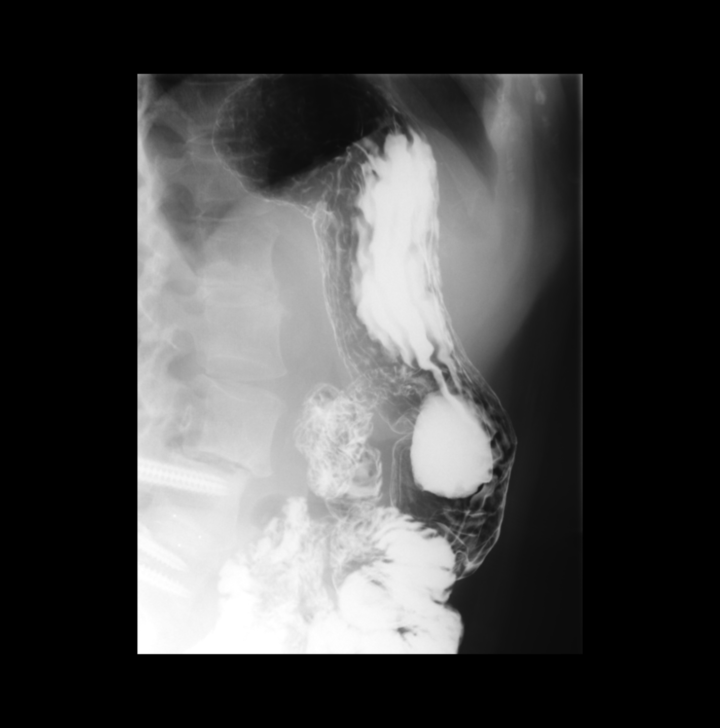

[Series 14: cp_standard · 0.52mm/px · 1 of 37 frames shown (1 of 3)]
[frame 16/37]
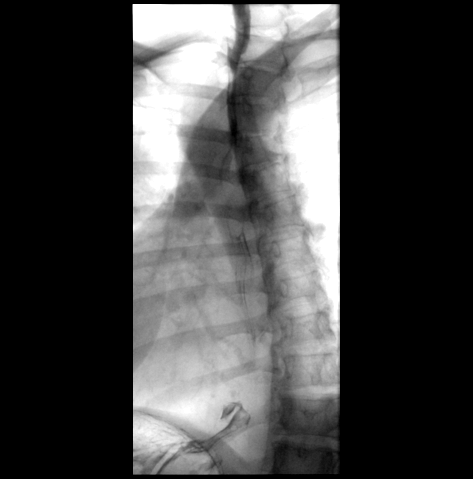

[Series 15: cp_standard · 0.52mm/px · 2 of 37 frames shown (2 of 3)]
[frame 6/37]
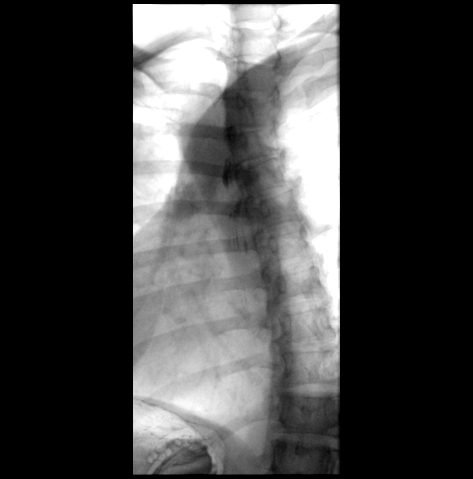
[frame 13/37]
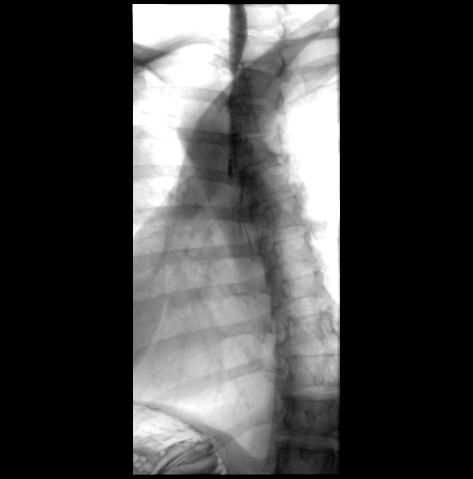

[Series 16: cp_standard · 0.52mm/px · 2 of 30 frames shown (3 of 3)]
[frame 5/30]
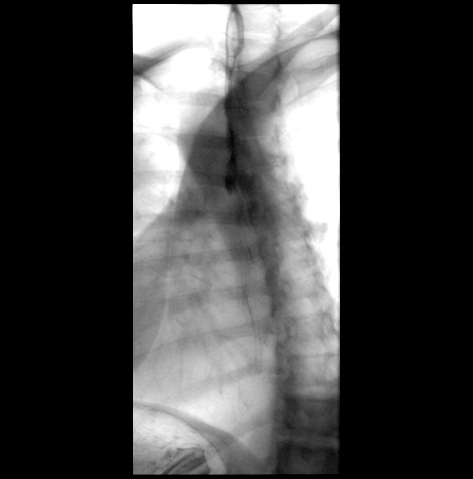
[frame 26/30]
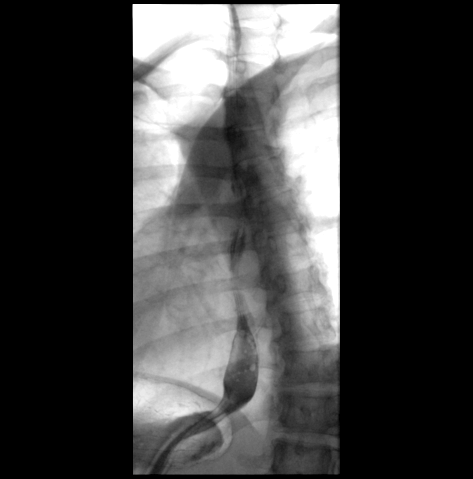

[Series 18: fluoro_barium 2fps_bw · 0.17mm/px · 1 of 1 slices shown (5 of 6)]
[im 1/1]
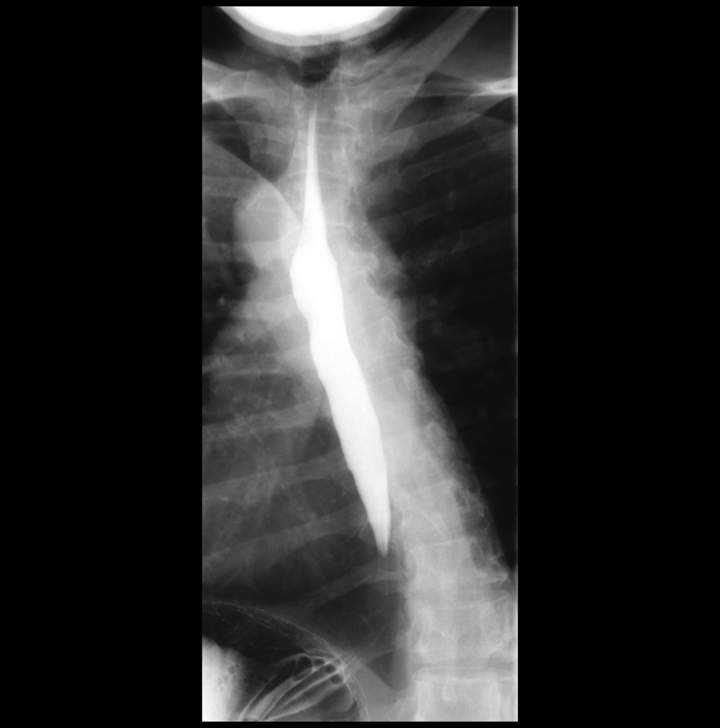

[Series 21: t abdomen barium · 0.15mm/px · 1 of 1 slices shown (2 of 2)]
[im 1/1]
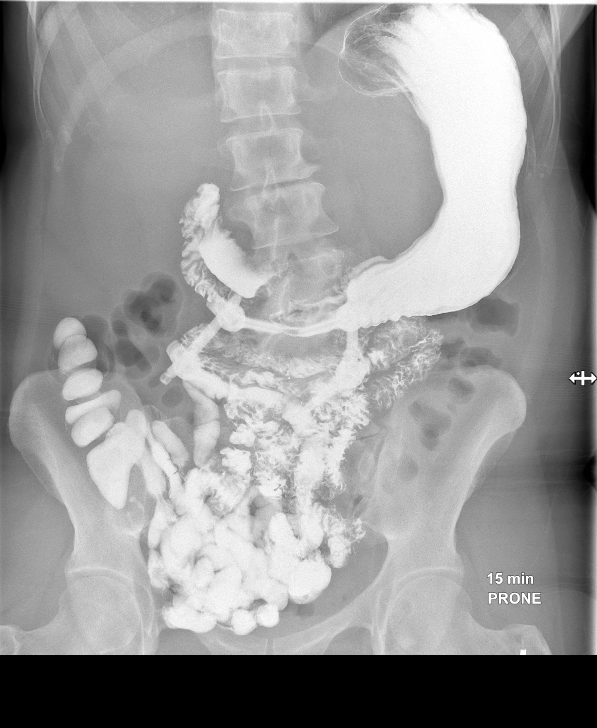

[Series 24: fluoro_barium 2fps_bw · 0.18mm/px · 1 of 1 slices shown (6 of 6)]
[im 1/1]
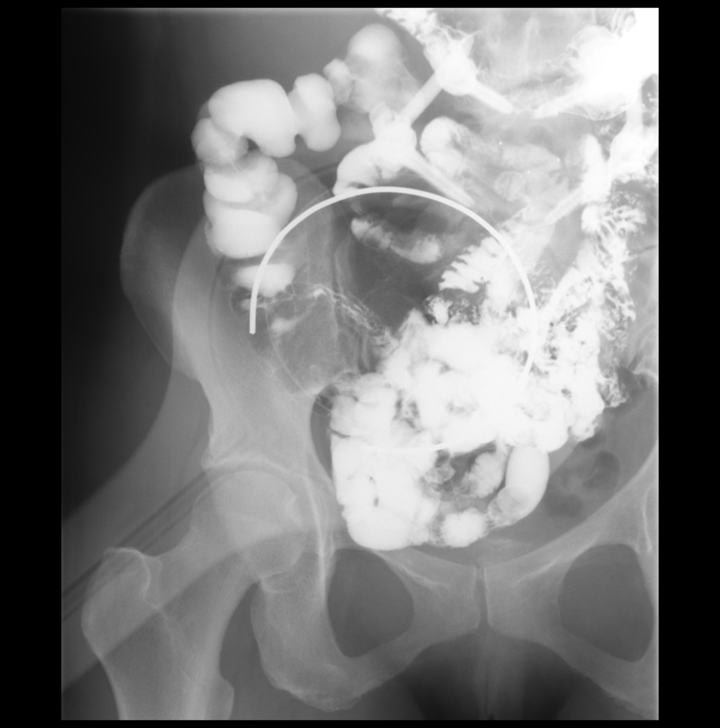

[13 of 24 positions shown; findings below may reference images not displayed]

FINDINGS: Double contrast images demonstrated a normal appearance of the
esophageal mucosa. Multiple single swallow attempts were observed,
which demonstrated normal esophageal motility. Full column
esophagram demonstrated no esophageal mass, stricture or esophageal
ring. No hiatal hernia. A barium tablet was administered, which
passed readily into the stomach.

Double contrast images of the stomach demonstrate no definite
gastric mass or gastric ulcer. Gastric mucosa is normal in
appearance.

Images of the small bowel demonstrate no small bowel dilatation,
definite strictures or other acute abnormality. Terminal ileum is
normal in appearance.
IMPRESSION: 1. Normal upper GI and small-bowel follow-through, as above.

## 2021-05-03 ENCOUNTER — Other Ambulatory Visit: Payer: Self-pay | Admitting: Gastroenterology

## 2021-05-03 DIAGNOSIS — R112 Nausea with vomiting, unspecified: Secondary | ICD-10-CM

## 2021-05-03 DIAGNOSIS — R1084 Generalized abdominal pain: Secondary | ICD-10-CM

## 2021-05-03 DIAGNOSIS — R1310 Dysphagia, unspecified: Secondary | ICD-10-CM

## 2021-05-03 DIAGNOSIS — R197 Diarrhea, unspecified: Secondary | ICD-10-CM

## 2021-08-03 DIAGNOSIS — Z79899 Other long term (current) drug therapy: Secondary | ICD-10-CM | POA: Diagnosis not present

## 2021-10-06 DIAGNOSIS — Z79899 Other long term (current) drug therapy: Secondary | ICD-10-CM | POA: Diagnosis not present

## 2021-10-06 DIAGNOSIS — Z1211 Encounter for screening for malignant neoplasm of colon: Secondary | ICD-10-CM | POA: Diagnosis not present

## 2021-10-06 DIAGNOSIS — I10 Essential (primary) hypertension: Secondary | ICD-10-CM | POA: Diagnosis not present

## 2021-10-06 DIAGNOSIS — F419 Anxiety disorder, unspecified: Secondary | ICD-10-CM | POA: Diagnosis not present

## 2021-10-06 DIAGNOSIS — Z0000 Encounter for general adult medical examination without abnormal findings: Secondary | ICD-10-CM | POA: Diagnosis not present

## 2021-10-06 DIAGNOSIS — Z800 Family history of malignant neoplasm of digestive organs: Secondary | ICD-10-CM | POA: Diagnosis not present

## 2021-10-06 DIAGNOSIS — E782 Mixed hyperlipidemia: Secondary | ICD-10-CM | POA: Diagnosis not present

## 2021-10-06 DIAGNOSIS — K529 Noninfective gastroenteritis and colitis, unspecified: Secondary | ICD-10-CM | POA: Diagnosis not present

## 2021-10-07 DIAGNOSIS — E782 Mixed hyperlipidemia: Secondary | ICD-10-CM | POA: Diagnosis not present

## 2021-10-07 DIAGNOSIS — Z800 Family history of malignant neoplasm of digestive organs: Secondary | ICD-10-CM | POA: Diagnosis not present

## 2021-10-07 DIAGNOSIS — I10 Essential (primary) hypertension: Secondary | ICD-10-CM | POA: Diagnosis not present

## 2021-10-07 DIAGNOSIS — Z1509 Genetic susceptibility to other malignant neoplasm: Secondary | ICD-10-CM | POA: Diagnosis not present

## 2021-10-27 DIAGNOSIS — Z79899 Other long term (current) drug therapy: Secondary | ICD-10-CM | POA: Diagnosis not present

## 2021-10-27 DIAGNOSIS — M0589 Other rheumatoid arthritis with rheumatoid factor of multiple sites: Secondary | ICD-10-CM | POA: Diagnosis not present

## 2021-10-27 DIAGNOSIS — M79643 Pain in unspecified hand: Secondary | ICD-10-CM | POA: Diagnosis not present

## 2021-10-27 DIAGNOSIS — M5459 Other low back pain: Secondary | ICD-10-CM | POA: Diagnosis not present

## 2021-10-27 DIAGNOSIS — M1990 Unspecified osteoarthritis, unspecified site: Secondary | ICD-10-CM | POA: Diagnosis not present

## 2021-10-27 DIAGNOSIS — M19049 Primary osteoarthritis, unspecified hand: Secondary | ICD-10-CM | POA: Diagnosis not present

## 2021-10-27 DIAGNOSIS — M8580 Other specified disorders of bone density and structure, unspecified site: Secondary | ICD-10-CM | POA: Diagnosis not present

## 2021-10-27 DIAGNOSIS — R197 Diarrhea, unspecified: Secondary | ICD-10-CM | POA: Diagnosis not present

## 2021-10-28 DIAGNOSIS — Z79899 Other long term (current) drug therapy: Secondary | ICD-10-CM | POA: Diagnosis not present

## 2022-03-21 ENCOUNTER — Telehealth: Payer: Self-pay | Admitting: Gastroenterology

## 2022-03-21 NOTE — Telephone Encounter (Signed)
Pt stated that's she is requesting an EUS be done along with the Colonoscopy: Pt states that her sister had pancreatic cancer: Pt was scheduled an office visit with Dr. Lyndel Safe on 04/15/2022 at 1:50 in the [place] office: Pt made aware: Address provided ?Pt verbalized understanding with all questions answered.  ? ?

## 2022-03-21 NOTE — Telephone Encounter (Signed)
Patient called asking about EUS procedure would like to know if it can detect if there is any Prostate cancer? To possibly be done along with a colonoscopy. ? ?She is aware that he can be done already just with another provider Ardis Hughs and\or [place]) if ordered.Marland Kitchen ?

## 2022-04-15 ENCOUNTER — Encounter: Payer: Self-pay | Admitting: Gastroenterology

## 2022-04-15 ENCOUNTER — Telehealth: Payer: Self-pay | Admitting: Gastroenterology

## 2022-04-15 ENCOUNTER — Telehealth: Payer: Self-pay

## 2022-04-15 ENCOUNTER — Ambulatory Visit (INDEPENDENT_AMBULATORY_CARE_PROVIDER_SITE_OTHER): Payer: PPO | Admitting: Gastroenterology

## 2022-04-15 VITALS — BP 128/82 | HR 70 | Ht 63.0 in | Wt 127.1 lb

## 2022-04-15 DIAGNOSIS — Z800 Family history of malignant neoplasm of digestive organs: Secondary | ICD-10-CM

## 2022-04-15 DIAGNOSIS — R197 Diarrhea, unspecified: Secondary | ICD-10-CM

## 2022-04-15 DIAGNOSIS — Z86010 Personal history of colonic polyps: Secondary | ICD-10-CM

## 2022-04-15 MED ORDER — DICYCLOMINE HCL 10 MG PO CAPS: 10.0000 mg | ORAL_CAPSULE | Freq: Two times a day (BID) | ORAL | 6 refills | Status: DC

## 2022-04-15 NOTE — Progress Notes (Signed)
Chief Complaint:   Referring Provider:  Myrlene Broker, MD      ASSESSMENT AND PLAN;    #1 [place] panc Ca. Nl CA 19-9. + BRCA1 gene mutation. Has been told by sister's oncologist to get EUS.  #2. H/O polyps Jan 2021   Plan:  -EUS for pancreatic Ca screening.  Will run it by Dr. Ardis Hughs.  May also need pre-cert from [employer]. -Rpt Colon 11/2022 -Continue Bentyl 89m po BID  #60, 6 RF   HPI:    PBalbina Depaceis a 61y.o. female  Very anxious  Unfortunately, her sister passed away d/t pancreatic Ca at age 60 She was advised by her sister's oncologist to be screened for pancreatic cancer by EUS. Recent CA 19-9 (Nov 2022) was normal at 19.3. Also, she has H/O BRCA1 gene mutation and had undergone hysterectomy in past.  Declined genetic Counselor consultation.  Denies having any further GI problems except for occasional abdominal bloating and discomfort.  Better with Bentyl.  No further diarrhea.  No nausea, vomiting, heartburn, regurgitation, odynophagia or dysphagia.  No melena or hematochezia. No unintentional weight loss. No abdominal pain.  No jaundice dark urine or pale stools.  Nl CMP, amylase/lipase, CA125, CA 19-9, HP antigen.  Wt Readings from Last 3 Encounters:  04/15/22 127 lb 2 oz (57.7 kg)  05/05/20 115 lb 2 oz (52.2 kg)  05/05/20 115 lb 2 oz (52.2 kg)      Past GI procedures: -Colonoscopy 12/11/2019 (part of colorectal cancer screening).  Advanced to TI.  1 cm tubular adenoma s/p polypectomy, mild pancolonic diverticulosis.  Otherwise normal colonoscopy.  Repeat in 3 years.  UGI with small bowel series 04/2020: Nl.  No Crohn's disease.  CT Abdo/pelvis 04/21/2020: No acute abnormalities.  Normal pancreas.  Mild intrahepatic ductal dilatation.  Small liver cysts. Patient had normal LFTs.  Past Medical History:  Diagnosis Date   Anxiety    OCC   Arthritis    RA   Hypertension     Past Surgical History:  Procedure Laterality Date   ABDOMINAL  HYSTERECTOMY  2016   gynecological procedure     LUMBAR FUSION  2014    Family History  Problem Relation Age of Onset   Breast cancer Mother    Ovarian cancer Mother    Pancreatic cancer Sister    Ovarian cancer Niece    Colon polyps Neg Hx    Colon cancer Neg Hx    Esophageal cancer Neg Hx    Rectal cancer Neg Hx    Stomach cancer Neg Hx     Social History   Tobacco Use   Smoking status: Former   Smokeless tobacco: Never   Tobacco comments:    stopped age 61  Vaping Use   Vaping Use: Never used  Substance Use Topics   Alcohol use: Yes    Comment: occ wine    Drug use: Never    Current Outpatient Medications  Medication Sig Dispense Refill   acetaminophen (TYLENOL) 500 MG tablet Take 500 mg by mouth every 6 (six) hours as needed.     ALPRAZolam (XANAX) 1 MG tablet alprazolam 1 mg tablet     amLODipine-valsartan (EXFORGE) 10-320 MG tablet amlodipine 10 mg-valsartan 320 mg tablet     Calcium Carb-Cholecalciferol 500-400 MG-UNIT CHEW Chew by mouth.     celecoxib (CELEBREX) 200 MG capsule SMARTSIG:1 Capsule(s) By Mouth 1 to 2 Times Daily     folic acid (FOLVITE) 1 MG  tablet folic acid 1 mg tablet     leflunomide (ARAVA) 20 MG tablet leflunomide 20 mg tablet     valACYclovir (VALTREX) 500 MG tablet valacyclovir 500 mg tablet     dicyclomine (BENTYL) 10 MG capsule TAKE 1 CAPSULE (10 MG TOTAL) BY MOUTH IN THE MORNING AND AT BEDTIME. 180 capsule 2   No current facility-administered medications for this visit.    Allergies  Allergen Reactions   Hydrocodone Itching   Lisinopril Itching and Rash   Penicillins Itching and Other (See Comments)    As a child As a child     Review of Systems:  Constitutional: has fever, chills, diaphoresis, has appetite change and extreme fatigue.  HEENT: Denies photophobia, eye pain, redness, hearing loss, ear pain, congestion, sore throat, rhinorrhea, sneezing, mouth sores, neck pain, neck stiffness and tinnitus.   Respiratory: Denies  SOB, DOE, cough, chest tightness,  and wheezing.   Cardiovascular: Denies chest pain, palpitations and leg swelling.  Genitourinary: Denies dysuria, urgency, frequency, hematuria, flank pain and difficulty urinating.  Musculoskeletal: has myalgias, back pain, joint swelling, arthralgias and gait problem.  Skin: No rash.  Neurological: Denies dizziness, seizures, syncope, weakness, light-headedness, numbness and has headaches.  Hematological: Denies adenopathy. Easy bruising, personal or family bleeding history  Psychiatric/Behavioral:has anxiety or depression     Physical Exam:    BP 128/82   Pulse 70   Ht _0  (1.6 m)   Wt 127 lb 2 oz (57.7 kg)   SpO2 96%   BMI 22.52 kg/m  Wt Readings from Last 3 Encounters:  04/15/22 127 lb 2 oz (57.7 kg)  05/05/20 115 lb 2 oz (52.2 kg)  05/05/20 115 lb 2 oz (52.2 kg)   Constitutional: Thin built white female.  Accompanied by her husband.  Very anxious. Psychiatric: Normal mood and affect. Behavior is normal. HEENT: Pupils normal.  Conjunctivae are normal. No scleral icterus. Cardiovascular: Normal rate, regular rhythm. No edema Pulmonary/chest: Effort normal and breath sounds normal. No wheezing, rales or rhonchi. Abdominal: Soft, nondistended. Nontender. Bowel sounds active throughout. There are no masses palpable. No hepatomegaly. Rectal:  defered Neurological: Alert and oriented to person place and time. Skin: Skin is warm and dry. No rashes noted.   Carmell Austria, MD 04/15/2022, 2:17 PM  Cc: Myrlene Broker, MD

## 2022-04-15 NOTE — Telephone Encounter (Signed)
Renee Reeves, Can you forward a copy of your OV note when finished. I could tell you're still working on it.    Thanks

## 2022-04-15 NOTE — Telephone Encounter (Addendum)
Hey Dr Lyndel Safe wants this patient to have an EUS with Dr Ardis Hughs or Dr Rush Landmark. Can you please look into this? Its for pancreatic screening

## 2022-04-15 NOTE — Patient Instructions (Addendum)
If you are age 60 or older, your body mass index should be between 23-30. Your Body mass index is 22.52 kg/m. If this is out of the aforementioned range listed, please consider follow up with your Primary Care Provider.  If you are age 57 or younger, your body mass index should be between 19-25. Your Body mass index is 22.52 kg/m. If this is out of the aformentioned range listed, please consider follow up with your Primary Care Provider.   ________________________________________________________  The [facility] providers would like to encourage you to use [facility] to communicate with providers for non-urgent requests or questions.  Due to long hold times on the telephone, sending your provider a message by [facility] may be a faster and more efficient way to get a response.  Please allow 48 business hours for a response.  Please remember that this is for non-urgent requests.  _______________________________________________________  We have sent the following medications to your pharmacy for you to pick up at your convenience: Bentyl  Colon due 11-2022. Please call 2 months before to get this scheduled but we will sent a letter as it gets closer.  Please call us in 2 weeks regarding your EUS.  Thank you,  Dr. Jackquline Denmark

## 2022-04-15 NOTE — Telephone Encounter (Signed)
FYI Dr. Gupta.

## 2022-04-15 NOTE — Telephone Encounter (Signed)
Please advise Dr Ardis Hughs Dr Rush Landmark

## 2022-04-16 NOTE — Telephone Encounter (Signed)
I just reviewed RG's note. Patient has history of BRCA1 and has a family history of a first-degree relatives (sister) with pancreatic cancer. She meets criteria to consider high risk pancreatic cancer screening. I think I would begin by ensuring that the patient is interested in performing pancreatic cancer screening.  The only way works is if we do it on a regular basis. We have been doing mostly patient's with a every 6 month MRI/MRCP and EUS alternating versus if issues from an insurance perspective alternating MRI/MRCP with EUS yearly. If the patient is willing to undergo the screening protocol, we would be happy to see her. We would begin with an MRI/MRCP first before endoscopic ultrasound so that a good visualization is seen of the entire parenchyma.  Certainly if something is seen on MRI/MRCP that would require an earlier EUS in 6 months we can set that up. RG, what you think about this plan of action? Thanks. GM

## 2022-04-21 NOTE — Telephone Encounter (Signed)
Staff message sent to Dr. Reginal Lutes

## 2022-05-20 NOTE — Telephone Encounter (Signed)
I have discussed with the patient in detail regarding PC cancer screening   Remo Lipps, Proceed with MRI/MRCP for PC screening Please get it approved from insurance company Next time around, she would need EUS RG   Vito, Please put her on for PC screening protocol last RG

## 2022-05-23 ENCOUNTER — Other Ambulatory Visit: Payer: Self-pay

## 2022-05-23 DIAGNOSIS — Z800 Family history of malignant neoplasm of digestive organs: Secondary | ICD-10-CM

## 2022-05-23 NOTE — Telephone Encounter (Signed)
Pt made aware of Dr. Lyndel Safe recommendations:  MRI/MRCP for PC screening ordered for [facility]: Pt was notified that  [facility] will reach out to pt with date and time of test. Pt was encouraged to question them about the coverage of insurance  for the test:  Pt verbalized understanding with all questions answered.

## 2022-06-11 ENCOUNTER — Ambulatory Visit
Admission: RE | Admit: 2022-06-11 | Discharge: 2022-06-11 | Disposition: A | Discharge status: Home / Self Care | Payer: PPO | Source: Ambulatory Visit | Attending: Gastroenterology | Admitting: Gastroenterology

## 2022-06-11 DIAGNOSIS — Z800 Family history of malignant neoplasm of digestive organs: Secondary | ICD-10-CM

## 2022-06-11 MED ORDER — GADOBENATE DIMEGLUMINE 529 MG/ML IV SOLN
11.0000 mL | Freq: Once | INTRAVENOUS | Status: AC | PRN
  Administered 2022-06-11: 11 mL via INTRAVENOUS

## 2022-08-10 ENCOUNTER — Telehealth: Payer: Self-pay | Admitting: Gastroenterology

## 2022-08-10 ENCOUNTER — Other Ambulatory Visit: Payer: Self-pay

## 2022-08-10 MED ORDER — DICYCLOMINE HCL 10 MG PO CAPS: 10.0000 mg | ORAL_CAPSULE | Freq: Two times a day (BID) | ORAL | 6 refills | Status: DC

## 2022-08-10 NOTE — Telephone Encounter (Signed)
Medication sent to patient's pharmacy.

## 2022-08-10 NOTE — Telephone Encounter (Signed)
Inbound call from patient needing a prescription refill for Dicyclomine medication. Patient states pharmacy has been trying to get in touch with the office , waiting for  approval. Please advise.  Thank you

## 2022-08-11 ENCOUNTER — Other Ambulatory Visit (HOSPITAL_COMMUNITY): Payer: Self-pay

## 2022-08-11 ENCOUNTER — Other Ambulatory Visit: Payer: Self-pay

## 2022-08-11 MED ORDER — DICYCLOMINE HCL 10 MG PO CAPS: 10.0000 mg | ORAL_CAPSULE | Freq: Two times a day (BID) | ORAL | 6 refills | Status: AC

## 2022-08-11 NOTE — Telephone Encounter (Signed)
Called patient. Patient stated that medication needed to go to [facility] on [address]. Medication has been sent to pharmacy.

## 2022-08-11 NOTE — Telephone Encounter (Signed)
Patient called back. States she is having trouble picking up her medication. States the pharmacy asked her to reach out to [place]. Requesting a call back as soon as possible. Please call to advise.

## 2023-01-13 ENCOUNTER — Encounter: Payer: Self-pay | Admitting: Gastroenterology

## 2023-01-30 ENCOUNTER — Encounter: Payer: Self-pay | Admitting: Gastroenterology

## 2023-04-04 ENCOUNTER — Encounter: Payer: Self-pay | Admitting: Gastroenterology

## 2023-04-04 ENCOUNTER — Ambulatory Visit (AMBULATORY_SURGERY_CENTER): Payer: PPO

## 2023-04-04 VITALS — Ht 63.0 in | Wt 125.0 lb

## 2023-04-04 DIAGNOSIS — Z86010 Personal history of colonic polyps: Secondary | ICD-10-CM

## 2023-04-04 NOTE — Progress Notes (Signed)

## 2023-04-10 ENCOUNTER — Telehealth: Payer: Self-pay | Admitting: Gastroenterology

## 2023-04-10 NOTE — Telephone Encounter (Signed)
Returned patient call to clarify instructions. OK to continue CELEBREX.

## 2023-04-10 NOTE — Telephone Encounter (Signed)
Inbound call from patient with question regarding prep . Please advise.  Thank you

## 2023-04-18 ENCOUNTER — Ambulatory Visit (AMBULATORY_SURGERY_CENTER): Payer: PPO | Admitting: Gastroenterology

## 2023-04-18 ENCOUNTER — Encounter: Payer: Self-pay | Admitting: Gastroenterology

## 2023-04-18 VITALS — BP 119/73 | HR 64 | Temp 98.9°F | Resp 15 | Ht 63.0 in | Wt 125.0 lb

## 2023-04-18 DIAGNOSIS — Z86010 Personal history of colonic polyps: Secondary | ICD-10-CM | POA: Diagnosis not present

## 2023-04-18 DIAGNOSIS — Z09 Encounter for follow-up examination after completed treatment for conditions other than malignant neoplasm: Secondary | ICD-10-CM

## 2023-04-18 DIAGNOSIS — K5100 Ulcerative (chronic) pancolitis without complications: Secondary | ICD-10-CM

## 2023-04-18 MED ORDER — SODIUM CHLORIDE 0.9 % IV SOLN
500.0000 mL | Freq: Once | INTRAVENOUS | Status: DC
Start: 2023-04-18 — End: 2023-04-18

## 2023-04-18 NOTE — Progress Notes (Signed)
Uneventful anesthetic. Report to pacu rn. Vss. Care resumed by rn. 

## 2023-04-18 NOTE — Progress Notes (Signed)
Chief Complaint:   Referring Provider:  Hadley Pen, MD      ASSESSMENT AND PLAN;    #1 H/O polyps Jan 2021 #2.  Diarrhea   Plan:  -Rpt Colon  -Continue Bentyl 10mg  po BID  #60, 6 RF   HPI:    Renee Reeves is a 62 y.o. female  Very anxious  Unfortunately, her sister passed away d/t pancreatic Ca at age 3. She was advised by her sister's oncologist to be screened for pancreatic cancer by EUS. Recent CA 19-9 (Nov 2022) was normal at 19.3. Also, she has H/O BRCA1 gene mutation and had undergone hysterectomy in past.  Declined genetic Counselor consultation.  Denies having any further GI problems except for occasional abdominal bloating and discomfort.  Better with Bentyl.  Occ diarrhea.  No nausea, vomiting, heartburn, regurgitation, odynophagia or dysphagia.  No melena or hematochezia. No unintentional weight loss. No abdominal pain.  No jaundice dark urine or pale stools.  Nl CMP, amylase/lipase, CA125, CA 19-9, HP antigen.  Wt Readings from Last 3 Encounters:  04/18/23 125 lb (56.7 kg)  04/04/23 125 lb (56.7 kg)  04/15/22 127 lb 2 oz (57.7 kg)      Past GI procedures: -Colonoscopy 12/11/2019 (part of colorectal cancer screening).  Advanced to TI.  1 cm tubular adenoma s/p polypectomy, mild pancolonic diverticulosis.  Otherwise normal colonoscopy.  Repeat in 3 years.  UGI with small bowel series 04/2020: Nl.  No Crohn's disease.  CT Abdo/pelvis 04/21/2020: No acute abnormalities.  Normal pancreas.  Mild intrahepatic ductal dilatation.  Small liver cysts. Patient had normal LFTs.  Past Medical History:  Diagnosis Date   Anxiety    OCC   Arthritis    RA   Hypertension     Past Surgical History:  Procedure Laterality Date   ABDOMINAL HYSTERECTOMY  2016   gynecological procedure     LUMBAR FUSION  2014    Family History  Problem Relation Age of Onset   Breast cancer Mother    Ovarian cancer Mother    Pancreatic cancer Sister    Ovarian  cancer Niece    Colon polyps Neg Hx    Colon cancer Neg Hx    Esophageal cancer Neg Hx    Rectal cancer Neg Hx    Stomach cancer Neg Hx     Social History   Tobacco Use   Smoking status: Former   Smokeless tobacco: Never   Tobacco comments:    stopped age 26   Vaping Use   Vaping Use: Never used  Substance Use Topics   Alcohol use: Yes    Comment: occ wine    Drug use: Never    Current Outpatient Medications  Medication Sig Dispense Refill   acetaminophen (TYLENOL) 500 MG tablet Take 500 mg by mouth every 6 (six) hours as needed.     ALPRAZolam (XANAX) 1 MG tablet alprazolam 1 mg tablet     amLODipine-valsartan (EXFORGE) 10-320 MG tablet amlodipine 10 mg-valsartan 320 mg tablet     Calcium Carb-Cholecalciferol 500-400 MG-UNIT CHEW Chew by mouth.     celecoxib (CELEBREX) 200 MG capsule SMARTSIG:1 Capsule(s) By Mouth 1 to 2 Times Daily     dicyclomine (BENTYL) 10 MG capsule TAKE 1 CAPSULE (10 MG TOTAL) BY MOUTH IN THE MORNING AND AT BEDTIME. 180 capsule 2   dicyclomine (BENTYL) 10 MG capsule Take 1 capsule (10 mg total) by mouth 2 (two) times daily. 60 capsule 6   folic acid (  FOLVITE) 1 MG tablet folic acid 1 mg tablet     leflunomide (ARAVA) 20 MG tablet leflunomide 20 mg tablet     valACYclovir (VALTREX) 500 MG tablet valacyclovir 500 mg tablet     Current Facility-Administered Medications  Medication Dose Route Frequency Provider Last Rate Last Admin   0.9 %  sodium chloride infusion  500 mL Intravenous Once Lynann Bologna, MD        Allergies  Allergen Reactions   Hydrocodone Itching   Lisinopril Itching and Rash   Penicillins Itching and Other (See Comments)    As a child As a child     Review of Systems:  Constitutional: has fever, chills, diaphoresis, has appetite change and extreme fatigue.  HEENT: Denies photophobia, eye pain, redness, hearing loss, ear pain, congestion, sore throat, rhinorrhea, sneezing, mouth sores, neck pain, neck stiffness and tinnitus.    Respiratory: Denies SOB, DOE, cough, chest tightness,  and wheezing.   Cardiovascular: Denies chest pain, palpitations and leg swelling.  Genitourinary: Denies dysuria, urgency, frequency, hematuria, flank pain and difficulty urinating.  Musculoskeletal: has myalgias, back pain, joint swelling, arthralgias and gait problem.  Skin: No rash.  Neurological: Denies dizziness, seizures, syncope, weakness, light-headedness, numbness and has headaches.  Hematological: Denies adenopathy. Easy bruising, personal or family bleeding history  Psychiatric/Behavioral:has anxiety or depression     Physical Exam:    BP 134/78   Pulse 70   Temp 98.9 F (37.2 C)   Ht 5\' 3"  (1.6 m)   Wt 125 lb (56.7 kg)   SpO2 98%   BMI 22.14 kg/m  Wt Readings from Last 3 Encounters:  04/18/23 125 lb (56.7 kg)  04/04/23 125 lb (56.7 kg)  04/15/22 127 lb 2 oz (57.7 kg)   Constitutional: Thin built white female.  Accompanied by her husband.  Very anxious. Psychiatric: Normal mood and affect. Behavior is normal. HEENT: Pupils normal.  Conjunctivae are normal. No scleral icterus. Cardiovascular: Normal rate, regular rhythm. No edema Pulmonary/chest: Effort normal and breath sounds normal. No wheezing, rales or rhonchi. Abdominal: Soft, nondistended. Nontender. Bowel sounds active throughout. There are no masses palpable. No hepatomegaly. Rectal:  defered Neurological: Alert and oriented to person place and time. Skin: Skin is warm and dry. No rashes noted.   [name], MD 04/18/2023, 11:05 AM  Cc: Hadley Pen, MD

## 2023-04-18 NOTE — Patient Instructions (Signed)
Handouts on diverticulosis and hemorrhoids given to you today Await pathology results   YOU HAD AN ENDOSCOPIC PROCEDURE TODAY AT THE [facility]:   Refer to the procedure report that was given to you for any specific questions about what was found during the examination.  If the procedure report does not answer your questions, please call your gastroenterologist to clarify.  If you requested that your care partner not be given the details of your procedure findings, then the procedure report has been included in a sealed envelope for you to review at your convenience later.  YOU SHOULD EXPECT: Some feelings of bloating in the abdomen. Passage of more gas than usual.  Walking can help get rid of the air that was put into your GI tract during the procedure and reduce the bloating. If you had a lower endoscopy (such as a colonoscopy or flexible sigmoidoscopy) you may notice spotting of blood in your stool or on the toilet paper. If you underwent a bowel prep for your procedure, you may not have a normal bowel movement for a few days.  Please Note:  You might notice some irritation and congestion in your nose or some drainage.  This is from the oxygen used during your procedure.  There is no need for concern and it should clear up in a day or so.  SYMPTOMS TO REPORT IMMEDIATELY:  Following lower endoscopy (colonoscopy or flexible sigmoidoscopy):  Excessive amounts of blood in the stool  Significant tenderness or worsening of abdominal pains  Swelling of the abdomen that is new, acute  Fever of 100F or higher  For urgent or emergent issues, a gastroenterologist can be reached at any hour by calling [phone]. Do not use MyChart messaging for urgent concerns.    DIET:  We do recommend a small meal at first, but then you may proceed to your regular diet.  Drink plenty of fluids but you should avoid alcoholic beverages for 24 hours.  ACTIVITY:  You should plan to take it easy for  the rest of today and you should NOT DRIVE or use heavy machinery until tomorrow (because of the sedation medicines used during the test).    FOLLOW UP: Our staff will call the number listed on your records the next business day following your procedure.  We will call around 7:15- 8:00 am to check on you and address any questions or concerns that you may have regarding the information given to you following your procedure. If we do not reach you, we will leave a message.     If any biopsies were taken you will be contacted by phone or by letter within the next 1-3 weeks.  Please call us at [phone] if you have not heard about the biopsies in 3 weeks.    SIGNATURES/CONFIDENTIALITY: You and/or your care partner have signed paperwork which will be entered into your electronic medical record.  These signatures attest to the fact that that the information above on your After Visit Summary has been reviewed and is understood.  Full responsibility of the confidentiality of this discharge information lies with you and/or your care-partner. 

## 2023-04-18 NOTE — Op Note (Signed)
[facility] Patient Name: Renee Reeves Procedure Date: 04/18/2023 11:11 AM MRN: [id number] Endoscopist: Lynann Bologna , MD, [id number] Age: 62 Referring MD:  Date of Birth: 02-11-1961 Gender: Female Account #: [id number] Procedure:                Colonoscopy Indications:              High risk colon cancer surveillance: Personal                            history of advanced colonic polyps 2021. H/O                            diarrhea Medicines:                Monitored Anesthesia Care Procedure:                Pre-Anesthesia Assessment:                           - Prior to the procedure, a History and Physical                            was performed, and patient medications and                            allergies were reviewed. The patient's tolerance of                            previous anesthesia was also reviewed. The risks                            and benefits of the procedure and the sedation                            options and risks were discussed with the patient.                            All questions were answered, and informed consent                            was obtained. Prior Anticoagulants: The patient has                            taken no anticoagulant or antiplatelet agents. ASA                            Grade Assessment: II - A patient with mild systemic                            disease. After reviewing the risks and benefits,                            the patient was deemed in satisfactory condition to  undergo the procedure.                           After obtaining informed consent, the colonoscope                            was passed under direct vision. Throughout the                            procedure, the patient's blood pressure, pulse, and                            oxygen saturations were monitored continuously. The                            [id number]-HQ190L Colonoscope [id number] was introduced                             through the anus and advanced to the 2 cm into the                            ileum. The colonoscopy was performed without                            difficulty. The patient tolerated the procedure                            well. The quality of the bowel preparation was                            good. The terminal ileum, ileocecal valve,                            appendiceal orifice, and rectum were photographed. Scope In: 11:16:38 AM Scope Out: 11:32:32 AM Scope Withdrawal Time: 0 hours 7 minutes 54 seconds  Total Procedure Duration: 0 hours 15 minutes 54 seconds  Findings:                 The colon (entire examined portion) appeared                            normal. Biopsies for histology were taken with a                            cold forceps from the entire colon for evaluation                            of microscopic colitis.                           A few medium-mouthed diverticula were found in the                            entire colon.  Non-bleeding internal hemorrhoids were found during                            retroflexion. The hemorrhoids were small and Grade                            I (internal hemorrhoids that do not prolapse).                           The exam was otherwise without abnormality on                            direct and retroflexion views. Complications:            No immediate complications. Estimated Blood Loss:     Estimated blood loss: none. Impression:               - The entire examined colon is normal. Biopsied.                           - Mild pancolonic div.                           - Non-bleeding internal hemorrhoids.                           - The examination was otherwise normal on direct                            and retroflexion views. Recommendation:           - Patient has a contact number available for                            emergencies. The signs and symptoms of potential                             delayed complications were discussed with the                            patient. Return to normal activities tomorrow.                            Written discharge instructions were provided to the                            patient.                           - Resume previous diet.                           - Continue present medications.                           - Await pathology results.                           -  Repeat colonoscopy in 5 years for surveillance                            d/t prev H/O polyps.                           - The findings and recommendations were discussed                            with the patient's family. Lynann Bologna, MD 04/18/2023 11:37:17 AM This report has been signed electronically.

## 2023-04-19 ENCOUNTER — Telehealth: Payer: Self-pay

## 2023-04-19 NOTE — Telephone Encounter (Signed)
  Follow up Call-     04/18/2023   11:03 AM  Call back number  Post procedure Call Back phone  # [phone]  Permission to leave phone message Yes     Patient questions:  Do you have a fever, pain , or abdominal swelling? No. Pain Score  0 *  Have you tolerated food without any problems? Yes.    Have you been able to return to your normal activities? Yes.    Do you have any questions about your discharge instructions: Diet   No. Medications  No. Follow up visit  No.  Do you have questions or concerns about your Care? No.  Actions: * If pain score is 4 or above: No action needed, pain <4.

## 2023-04-26 ENCOUNTER — Encounter: Payer: Self-pay | Admitting: Gastroenterology

## 2023-06-14 ENCOUNTER — Other Ambulatory Visit: Payer: Self-pay

## 2023-06-14 ENCOUNTER — Telehealth: Payer: Self-pay

## 2023-06-14 DIAGNOSIS — Z800 Family history of malignant neoplasm of digestive organs: Secondary | ICD-10-CM

## 2023-06-14 NOTE — Telephone Encounter (Signed)
Personal reminder was received in Epic for 1 year. Pt needs EUS as recommended by Dr. Chales Abrahams from the results on the MRI/MRCP on 06/11/2022: Please assist with scheduling EUS with Dr. Meridee Score.

## 2023-06-14 NOTE — Telephone Encounter (Signed)
EUS has been scheduled for 9/26 at 115 pm at [facility] with GM   Left message on machine to call back

## 2023-06-14 NOTE — Telephone Encounter (Signed)
-----   Message from Nurse Joselyn Glassman sent at 06/13/2022 12:16 PM EDT ----- Lynann Bologna, MD  Emeline Darling, RN Please inform the patient.  Neg MRCP. Excellent news.   EUS in 1 year with Dr. Mansouraty/Dr. Christella Hartigan for pancreatic CA screening.   Send report to family physician

## 2023-06-14 NOTE — Telephone Encounter (Signed)
Dr Mansouraty please advise  

## 2023-06-14 NOTE — Telephone Encounter (Signed)
Move forward with nonurgent upper EUS in the next 2 months for pancreatic cancer screening. Thanks. GM

## 2023-06-15 NOTE — Telephone Encounter (Signed)
Left message on machine to call back  

## 2023-06-16 NOTE — Telephone Encounter (Signed)
Left message on machine to call back  

## 2023-06-16 NOTE — Telephone Encounter (Signed)
The pt started chemo therapy for breast cancer and does not want to proceed with EUS at this time. She states she will call back when she is able to reschedule.

## 2023-08-17 ENCOUNTER — Encounter (HOSPITAL_COMMUNITY): Payer: Self-pay

## 2023-08-17 ENCOUNTER — Ambulatory Visit (HOSPITAL_COMMUNITY): Admit: 2023-08-17 | Payer: PPO | Admitting: Gastroenterology

## 2023-08-17 SURGERY — UPPER ENDOSCOPIC ULTRASOUND (EUS) RADIAL
Anesthesia: Monitor Anesthesia Care

## 2024-09-03 ENCOUNTER — Telehealth: Payer: Self-pay | Admitting: Gastroenterology

## 2024-09-03 ENCOUNTER — Other Ambulatory Visit: Payer: Self-pay

## 2024-09-03 DIAGNOSIS — Z800 Family history of malignant neoplasm of digestive organs: Secondary | ICD-10-CM

## 2024-09-03 NOTE — Telephone Encounter (Signed)
 Inbound call from patient requesting a call to discuss scheduling recall EUS. Please advise, thank you.

## 2024-09-03 NOTE — Telephone Encounter (Signed)
 The pt has been rescheduled for EUS on 12/11 at 1015 am at [facility] with GM  EUS scheduled, pt instructed and medications reviewed.  Patient instructions mailed to home.  Patient to call with any questions or concerns.

## 2024-09-03 NOTE — Telephone Encounter (Signed)
 Chart reviewed. Please see note below.

## 2024-10-22 ENCOUNTER — Telehealth: Payer: Self-pay | Admitting: Gastroenterology

## 2024-10-22 NOTE — Telephone Encounter (Signed)
 The pt has been advised that [facility] endo RN Olam has been advised and have made a note in the chart.   The patient has been notified of this information and all questions answered.

## 2024-10-22 NOTE — Telephone Encounter (Signed)
 Spoke with the pt. Discussed her questions. Pt wanted to know whether [facility] would be able to access her PAC instead of trying to start an IV. Advised pt that it shouldn't be a problem to have it accessed. Will reach out to endo department for further recommendations. All other questions answered.

## 2024-10-22 NOTE — Telephone Encounter (Signed)
 Inbound call from patient stating that she has questions regarding her upcoming her procedures scheduled for 12/11. Please advise.

## 2024-10-23 ENCOUNTER — Telehealth: Payer: Self-pay | Admitting: Gastroenterology

## 2024-10-23 NOTE — Telephone Encounter (Addendum)
 Procedure:Upper EUS Procedure date: 10/31/24 Procedure location: [facility] Arrival Time: 8:45 am Spoke with the patient Y/N: Yes Any prep concerns? No  Has the patient obtained the prep from the pharmacy ? No prep needed Do you have a care partner and transportation: Yes Any additional concerns? No

## 2024-10-24 ENCOUNTER — Encounter (HOSPITAL_COMMUNITY): Payer: Self-pay | Admitting: Gastroenterology

## 2024-10-24 NOTE — Progress Notes (Signed)
 Attempted to obtain medical history for pre op call via telephone, unable to reach at this time. HIPAA compliant voicemail message left requesting return call to pre surgical testing department.

## 2024-10-31 ENCOUNTER — Ambulatory Visit (HOSPITAL_COMMUNITY)
Admission: RE | Admit: 2024-10-31 | Discharge: 2024-10-31 | Disposition: A | Attending: Gastroenterology | Admitting: Gastroenterology

## 2024-10-31 ENCOUNTER — Telehealth: Payer: Self-pay

## 2024-10-31 ENCOUNTER — Encounter (HOSPITAL_COMMUNITY): Payer: Self-pay | Admitting: Gastroenterology

## 2024-10-31 ENCOUNTER — Ambulatory Visit (HOSPITAL_COMMUNITY): Admitting: Anesthesiology

## 2024-10-31 ENCOUNTER — Encounter (HOSPITAL_COMMUNITY): Admission: RE | Disposition: A | Payer: Self-pay | Source: Home / Self Care | Attending: Gastroenterology

## 2024-10-31 ENCOUNTER — Other Ambulatory Visit: Payer: Self-pay

## 2024-10-31 DIAGNOSIS — Z1501 Genetic susceptibility to malignant neoplasm of breast: Secondary | ICD-10-CM | POA: Insufficient documentation

## 2024-10-31 DIAGNOSIS — Z87891 Personal history of nicotine dependence: Secondary | ICD-10-CM | POA: Diagnosis not present

## 2024-10-31 DIAGNOSIS — K259 Gastric ulcer, unspecified as acute or chronic, without hemorrhage or perforation: Secondary | ICD-10-CM | POA: Diagnosis not present

## 2024-10-31 DIAGNOSIS — K2289 Other specified disease of esophagus: Secondary | ICD-10-CM | POA: Diagnosis not present

## 2024-10-31 DIAGNOSIS — K449 Diaphragmatic hernia without obstruction or gangrene: Secondary | ICD-10-CM | POA: Insufficient documentation

## 2024-10-31 DIAGNOSIS — Z803 Family history of malignant neoplasm of breast: Secondary | ICD-10-CM | POA: Diagnosis not present

## 2024-10-31 DIAGNOSIS — F419 Anxiety disorder, unspecified: Secondary | ICD-10-CM | POA: Diagnosis not present

## 2024-10-31 DIAGNOSIS — I899 Noninfective disorder of lymphatic vessels and lymph nodes, unspecified: Secondary | ICD-10-CM

## 2024-10-31 DIAGNOSIS — Z809 Family history of malignant neoplasm, unspecified: Secondary | ICD-10-CM | POA: Diagnosis not present

## 2024-10-31 DIAGNOSIS — M1990 Unspecified osteoarthritis, unspecified site: Secondary | ICD-10-CM | POA: Diagnosis not present

## 2024-10-31 DIAGNOSIS — Z800 Family history of malignant neoplasm of digestive organs: Secondary | ICD-10-CM | POA: Insufficient documentation

## 2024-10-31 DIAGNOSIS — K3189 Other diseases of stomach and duodenum: Secondary | ICD-10-CM | POA: Insufficient documentation

## 2024-10-31 DIAGNOSIS — Z9189 Other specified personal risk factors, not elsewhere classified: Secondary | ICD-10-CM

## 2024-10-31 DIAGNOSIS — I10 Essential (primary) hypertension: Secondary | ICD-10-CM | POA: Diagnosis not present

## 2024-10-31 DIAGNOSIS — Z1509 Genetic susceptibility to other malignant neoplasm: Secondary | ICD-10-CM | POA: Insufficient documentation

## 2024-10-31 HISTORY — PX: ESOPHAGOGASTRODUODENOSCOPY: SHX5428

## 2024-10-31 HISTORY — PX: EUS: SHX5427

## 2024-10-31 SURGERY — UPPER ENDOSCOPIC ULTRASOUND (EUS) RADIAL
Anesthesia: Monitor Anesthesia Care

## 2024-10-31 MED ORDER — HEPARIN SOD (PORK) LOCK FLUSH 100 UNIT/ML IV SOLN
500.0000 [IU] | INTRAVENOUS | Status: AC | PRN
  Administered 2024-10-31: 500 [IU]

## 2024-10-31 MED ORDER — PANTOPRAZOLE SODIUM 40 MG PO TBEC: 40.0000 mg | DELAYED_RELEASE_TABLET | Freq: Two times a day (BID) | ORAL | 12 refills | Status: DC

## 2024-10-31 MED ORDER — DEXMEDETOMIDINE HCL IN NACL 80 MCG/20ML IV SOLN
INTRAVENOUS | Status: DC | PRN
  Administered 2024-10-31: 12 ug via INTRAVENOUS

## 2024-10-31 MED ORDER — SODIUM CHLORIDE 0.9 % IV SOLN: INTRAVENOUS | Status: DC

## 2024-10-31 MED ORDER — EPHEDRINE SULFATE (PRESSORS) 25 MG/5ML IV SOSY
PREFILLED_SYRINGE | INTRAVENOUS | Status: DC | PRN
  Administered 2024-10-31: 10 mg via INTRAVENOUS

## 2024-10-31 MED ORDER — PROPOFOL 500 MG/50ML IV EMUL
INTRAVENOUS | Status: DC | PRN
  Administered 2024-10-31: 100 ug/kg/min via INTRAVENOUS
  Administered 2024-10-31: 50 mg via INTRAVENOUS
  Administered 2024-10-31: 100 mg via INTRAVENOUS

## 2024-10-31 MED ORDER — LIDOCAINE 2% (20 MG/ML) 5 ML SYRINGE
INTRAMUSCULAR | Status: DC | PRN
  Administered 2024-10-31: 100 mg via INTRAVENOUS

## 2024-10-31 NOTE — Anesthesia Preprocedure Evaluation (Addendum)
 Anesthesia Evaluation  Patient identified by MRN, date of birth, ID band Patient awake    Reviewed: Allergy & Precautions, H&P , NPO status , Patient's Chart, lab work & pertinent test results  Airway Mallampati: II  TM Distance: >3 FB Neck ROM: Full    Dental no notable dental hx.    Pulmonary neg pulmonary ROS, former smoker   Pulmonary exam normal breath sounds clear to auscultation       Cardiovascular Exercise Tolerance: Good hypertension, Pt. on medications negative cardio ROS Normal cardiovascular exam Rhythm:Regular Rate:Normal     Neuro/Psych   Anxiety     negative neurological ROS  negative psych ROS   GI/Hepatic negative GI ROS, Neg liver ROS,,,  Endo/Other  negative endocrine ROS    Renal/GU negative Renal ROS  negative genitourinary   Musculoskeletal negative musculoskeletal ROS (+) Arthritis ,    Abdominal   Peds negative pediatric ROS (+)  Hematology negative hematology ROS (+)   Anesthesia Other Findings   Reproductive/Obstetrics negative OB ROS                              Anesthesia Physical Anesthesia Plan  ASA: 2  Anesthesia Plan: MAC   Post-op Pain Management: Minimal or no pain anticipated   Induction: Intravenous  PONV Risk Score and Plan: 2 and Propofol infusion  Airway Management Planned: Mask, Natural Airway and Simple Face Mask  Additional Equipment: None  Intra-op Plan:   Post-operative Plan:   Informed Consent: I have reviewed the patients History and Physical, chart, labs and discussed the procedure including the risks, benefits and alternatives for the proposed anesthesia with the patient or authorized representative who has indicated his/her understanding and acceptance.       Plan Discussed with: Anesthesiologist  Anesthesia Plan Comments:          Anesthesia Quick Evaluation

## 2024-10-31 NOTE — Transfer of Care (Signed)
 Immediate Anesthesia Transfer of Care Note  Patient: Renee Reeves  Procedure(s) Performed: UPPER ENDOSCOPIC ULTRASOUND (EUS) RADIAL EGD (ESOPHAGOGASTRODUODENOSCOPY)  Patient Location: PACU  Anesthesia Type:MAC  Level of Consciousness: drowsy  Airway & Oxygen Therapy: Patient Spontanous Breathing and Patient connected to face mask oxygen  Post-op Assessment: Report given to RN and Post -op Vital signs reviewed and stable  Post vital signs: Reviewed and stable  Last Vitals:  Vitals Value Taken Time  BP    Temp    Pulse 88 10/31/24 10:58  Resp 10 10/31/24 10:58  SpO2 99 % 10/31/24 10:58  Vitals shown include unfiled device data.  Last Pain:  Vitals:   10/31/24 0947  TempSrc: Temporal  PainSc: 0-No pain         Complications: No notable events documented.

## 2024-10-31 NOTE — Op Note (Signed)
 [facility] Patient Name: Renee Reeves Procedure Date: 10/31/2024 MRN: [id number] Attending MD: [name] , MD, [id number] Date of Birth: [birth year] CSN: [id number] Age: 63 Admit Type: Outpatient Procedure:                Upper EUS Indications:              Initial EUS exam for pancreatic cancer screening in                            high-risk patient, Genetic mutation associated with                            pancreatic cancer risk identified; BRCA1 mutation                            positive, Family history of pancreatic ductal                            adenocarcinoma in one first-degree relative Providers:                [name], MD, Hoy Penner, RN, Farris Southgate, Technician Referring MD:              Medicines:                Monitored Anesthesia Care Complications:            No immediate complications. Estimated Blood Loss:     Estimated blood loss was minimal. Procedure:                Pre-Anesthesia Assessment:                           - Prior to the procedure, a History and Physical                            was performed, and patient medications and                            allergies were reviewed. The patient's tolerance of                            previous anesthesia was also reviewed. The risks                            and benefits of the procedure and the sedation                            options and risks were discussed with the patient.                            All questions were answered, and informed consent  was obtained. Prior Anticoagulants: The patient has                            taken no anticoagulant or antiplatelet agents. ASA                            Grade Assessment: III - A patient with severe                            systemic disease. After reviewing the risks and                            benefits, the patient was deemed in satisfactory                             condition to undergo the procedure.                           After obtaining informed consent, the endoscope was                            passed under direct vision. Throughout the                            procedure, the patient's blood pressure, pulse, and                            oxygen saturations were monitored continuously. The                            [id number]-[id number] ([id number]) Olympus endoscope was introduced                            through the mouth, and advanced to the second part                            of duodenum. The [id number]-[id number] ([id number]) Olympus                            duodenoscope was introduced through the mouth, and                            advanced to the area of papilla. The [id number]                            ([id number]) Olympus endosonoscope was introduced                            through the mouth, and advanced to the duodenum for                            ultrasound examination from the esophagus, stomach  and duodenum. The upper EUS was accomplished                            without difficulty. The patient tolerated the                            procedure. Scope In: Scope Out: Findings:      ENDOSCOPIC FINDING: :      No gross lesions were noted in the entire esophagus.      The Z-line was irregular and was found 37 cm from the incisors.      A 1 cm hiatal hernia was present.      Three non-bleeding cratered and superficial gastric ulcers with a clean       ulcer base (Forrest Class III) were found in the gastric antrum. The       largest lesion was 10 mm in largest dimension.      Localized moderately erythematous mucosa without bleeding was found in       the gastric antrum and in the prepyloric region of the stomach.      No other gross lesions were noted in the entire examined stomach.       Biopsies were taken with a cold forceps for histology and Helicobacter       pylori testing.      No  gross lesions were noted in the duodenal bulb, in the first portion       of the duodenum and in the second portion of the duodenum.      The major papilla was normal.      ENDOSONOGRAPHIC FINDING: :      There was no sign of significant endosonographic abnormality in the       pancreatic head (PD = 2.3 mm), genu of the pancreas (PD = 1.7 mm),       pancreatic body (PD = 1.7 mm), pancreatic tail (PD = 0.7 mm) and       uncinate process of the pancreas. No masses, no cysts, no       calcifications, the pancreatic duct was thin in caliber.      There was no sign of significant endosonographic abnormality in the       common bile duct (2.9 mm)and in the common hepatic duct (4.9 mm). An       unremarkable gallbladder and ducts with regular contour were identified.      Endosonographic imaging of the ampulla showed no intramural       (subepithelial) lesion.      No malignant-appearing lymph nodes were visualized in the celiac region       (level 20), perigastric region, peripancreatic region and porta hepatis       region.      Endosonographic imaging in the visualized portion of the liver showed no       lesion.      Endosonographic imaging in the gastroesophageal junction showed no       intramural (subepithelial) lesion.      The celiac region was visualized.      The esophagus, stomach and duodenum were examined endosonographically. Impression:               EGD Imression:                           -  No gross lesions in the entire esophagus. Z-line                            irregular, 37 cm from the incisors.                           - 1 cm hiatal hernia.                           - Non-bleeding gastric ulcers with a clean ulcer                            base (Forrest Class III) in the antrum.                            Erythematous mucosa in the antrum and prepyloric                            region of the stomach. No other gross lesions in                            the entire  stomach. Biopsied.                           - No gross lesions in the duodenal bulb, in the                            first portion of the duodenum and in the second                            portion of the duodenum.                           - Normal major papilla.                           EUS Impression:                           - There was no sign of significant pathology in the                            pancreatic head, genu of the pancreas, pancreatic                            body, pancreatic tail and uncinate process of the                            pancreas. No evidence of any masses/lesions/cysts.                           - There was no sign of significant pathology in the  common bile duct and in the common hepatic duct.                           - No malignant-appearing lymph nodes were                            visualized in the celiac region (level 20),                            perigastric region, peripancreatic region and porta                            hepatis region. Moderate Sedation:      Not Applicable - Patient had care per Anesthesia. Recommendation:           - The patient will be observed post-procedure,                            until all discharge criteria are met.                           - Patient has a contact number available for                            emergencies. The signs and symptoms of potential                            delayed complications were discussed with the                            patient. Return to normal activities tomorrow.                            Written discharge instructions were provided to the                            patient.                           - Discharge patient to home.                           - Resume previous diet.                           - Observe patient's clinical course.                           - Await path results.                           - Initiate PPI 40  mg twice daily.                           - Repeat EGD with primary GI in 28-months to ensure  healing of gastric ulcer.                           - For future surveillance for pancreatic cancer,                            alternating upper endoscopic ultrasound and                            cross-sectional imaging is recommended. Will                            recommend MRI/MRCP in 1-year for HR pancreatic                            cancer screening.                           - The findings and recommendations were discussed                            with the patient.                           - The findings and recommendations were discussed                            with the patient's family. Procedure Code(s):        --- Professional ---                           [phone], Esophagogastroduodenoscopy, flexible,                            transoral; with endoscopic ultrasound examination,                            including the esophagus, stomach, and either the                            duodenum or a surgically altered stomach where the                            jejunum is examined distal to the anastomosis                           43239, Esophagogastroduodenoscopy, flexible,                            transoral; with biopsy, single or multiple Diagnosis Code(s):        --- Professional ---                           K22.89, Other specified disease of esophagus                           K44.9,  Diaphragmatic hernia without obstruction or                            gangrene                           K25.9, Gastric ulcer, unspecified as acute or                            chronic, without hemorrhage or perforation                           K31.89, Other diseases of stomach and duodenum                           I89.9, Noninfective disorder of lymphatic vessels                            and lymph nodes, unspecified                           Z12.89, Encounter  for screening for malignant                            neoplasm of other sites                           Z15.09, Genetic susceptibility to other malignant                            neoplasm                           Z80.0, Family history of malignant neoplasm of                            digestive organs CPT copyright 2022 American [employer] Association. All rights reserved. The codes documented in this report are preliminary and upon coder review may  be revised to meet current compliance requirements. [name], MD 10/31/2024 11:09:27 AM Number of Addenda: 0

## 2024-10-31 NOTE — Anesthesia Postprocedure Evaluation (Signed)
 Anesthesia Post Note  Patient: Glass Blower/designer  Procedure(s) Performed: UPPER ENDOSCOPIC ULTRASOUND (EUS) RADIAL EGD (ESOPHAGOGASTRODUODENOSCOPY)     Patient location during evaluation: PACU Anesthesia Type: MAC Level of consciousness: awake and alert Pain management: pain level controlled Vital Signs Assessment: post-procedure vital signs reviewed and stable Respiratory status: spontaneous breathing, nonlabored ventilation, respiratory function stable and patient connected to nasal cannula oxygen Cardiovascular status: stable and blood pressure returned to baseline Postop Assessment: no apparent nausea or vomiting Anesthetic complications: no   No notable events documented.  Last Vitals:  Vitals:   10/31/24 1130 10/31/24 1140  BP: 108/62 124/62  Pulse: 66 71  Resp: 16 19  Temp:    SpO2: 100% 94%    Last Pain:  Vitals:   10/31/24 1140  TempSrc:   PainSc: 0-No pain                 Makenzee Choudhry

## 2024-10-31 NOTE — Telephone Encounter (Signed)
 Staff reminder sent to [facility] RN's to schedule EGD in 4 months once schedule becomes available and have patient come in for hgb A1C. Reminder also sent for MRI in 1 year.

## 2024-10-31 NOTE — Discharge Instructions (Signed)

## 2024-10-31 NOTE — Telephone Encounter (Signed)
-----   Message from Nurse Odetta SQUIBB, RN sent at 10/31/2024 11:27 AM EST ----- Regarding: FW: Follow-up Pod B Please see message from Dr Wilhelmenia regarding appts with Dr Charlanne ----- Message ----- From: Wilhelmenia Aloha Raddle., MD Sent: 10/31/2024  11:11 AM EST To: Odetta LITTIE Curly, RN; Lynnie Charlanne, MD Subject: Follow-up                                      RG, Please see EUS report for full details. Will need repeat EGD in 4 months for gastric ulcer healing. Recommend 1 year MRI/MRCP for high risk pancreas cancer screening.  Please also have her do hemoglobin A1c check within the next year. I will let you know the pathology when it returns of the stomach biopsies. Thanks. GM  Patty, Please coordinate repeat EGD in [facility] with Dr. Charlanne. Please place MRI recall order for 1 year under Dr. Ira name. Thanks. GM

## 2024-10-31 NOTE — H&P (Signed)
 GASTROENTEROLOGY PROCEDURE H&P NOTE   Primary Care Physician: Silver Lamar LABOR, MD  HPI: Renee Reeves is a 63 y.o. female  Past Medical History:  Diagnosis Date   Anxiety    OCC   Arthritis    RA   Hypertension    Past Surgical History:  Procedure Laterality Date   ABDOMINAL HYSTERECTOMY  2016   gynecological procedure     LUMBAR FUSION  2014   No current facility-administered medications for this encounter.   Current Medications[1] Allergies[2] Family History  Problem Relation Age of Onset   Breast cancer Mother    Ovarian cancer Mother    Pancreatic cancer Sister    Ovarian cancer Niece    Colon polyps Neg Hx    Colon cancer Neg Hx    Esophageal cancer Neg Hx    Rectal cancer Neg Hx    Stomach cancer Neg Hx    Social History   Socioeconomic History   Marital status: Single    Spouse name: Not on file   Number of children: Not on file   Years of education: Not on file   Highest education level: Not on file  Occupational History   Not on file  Tobacco Use   Smoking status: Former   Smokeless tobacco: Never   Tobacco comments:    stopped age 81   Vaping Use   Vaping status: Never Used  Substance and Sexual Activity   Alcohol use: Yes    Comment: occ wine    Drug use: Never   Sexual activity: Not on file  Other Topics Concern   Not on file  Social History Narrative   Not on file   Social Drivers of Health   Tobacco Use: Medium Risk (10/31/2024)   Patient History    Smoking Tobacco Use: Former    Smokeless Tobacco Use: Never    Passive Exposure: Not on Actuary Strain: Not on file  Food Insecurity: Low Risk (09/15/2023)   Received from [facility]   Epic    Within the past 12 months, you worried that your food would run out before you got money to buy more: Never true    Within the past 12 months, the food you bought just didn't last and you didn't have money to get more. : Never true  Transportation Needs: No  Transportation Needs (09/15/2023)   Received from [facility]    In the past 12 months, has lack of reliable transportation kept you from medical appointments, meetings, work or from getting things needed for daily living? : No  Physical Activity: Not on file  Stress: Not on file  Social Connections: Not on file  Intimate Partner Violence: Not on file  Depression (EYV7-0): Not on file  Alcohol Screen: Not on file  Housing: Low Risk (09/15/2023)   Received from [facility]   Epic    What is your living situation today?: I have a steady place to live    Think about the place you live. Do you have problems with any of the following? Choose all that apply:: Not on file  Utilities: Low Risk (09/15/2023)   Received from [facility]   Utilities    In the past 12 months has the electric, gas, oil, or water company threatened to shut off services in your home? : No  Health Literacy: Not on file    Physical Exam: There were no vitals filed for this visit. There is  no height or weight on file to calculate BMI. GEN: NAD EYE: Sclerae anicteric ENT: MMM CV: Non-tachycardic GI: Soft, NT/ND NEURO:  Alert & Oriented x 3  Lab Results: No results for input(s): WBC, HGB, HCT, PLT in the last 72 hours. BMET No results for input(s): NA, K, CL, CO2, GLUCOSE, BUN, CREATININE, CALCIUM in the last 72 hours. LFT No results for input(s): PROT, ALBUMIN, AST, ALT, ALKPHOS, BILITOT, BILIDIR, IBILI in the last 72 hours. PT/INR No results for input(s): LABPROT, INR in the last 72 hours.   Impression / Plan: This is a 63 y.o.female who presents for EGD/EUS for Pancreatic Cancer HR screening in setting of BRCA1 & FHx.  The risks of an EUS including intestinal perforation, bleeding, infection, aspiration, and medication effects were discussed as was the possibility it may not give a definitive diagnosis if a biopsy is performed.  When a  biopsy of the pancreas is done as part of the EUS, there is an additional risk of pancreatitis at the rate of about 1-2%.  It was explained that procedure related pancreatitis is typically mild, although it can be severe and even life threatening, which is why we do not perform random pancreatic biopsies and only biopsy a lesion/area we feel is concerning enough to warrant the risk.   The risks and benefits of endoscopic evaluation/treatment were discussed with the patient and/or family; these include but are not limited to the risk of perforation, infection, bleeding, missed lesions, lack of diagnosis, severe illness requiring hospitalization, as well as anesthesia and sedation related illnesses.  The patient's history has been reviewed, patient examined, no change in status, and deemed stable for procedure.  The patient and/or family was provided an opportunity to ask questions and all were answered.  The patient and/or family is agreeable to proceed.    [name], MD [facility] Office # [phone]     [1] No current facility-administered medications for this encounter. [2]  Allergies Allergen Reactions   Hydrocodone Itching   Lisinopril Itching and Rash   Penicillins Itching and Other (See Comments)    As a child As a child

## 2024-11-03 ENCOUNTER — Encounter (HOSPITAL_COMMUNITY): Payer: Self-pay | Admitting: Gastroenterology

## 2024-11-08 LAB — SURGICAL PATHOLOGY

## 2024-11-09 ENCOUNTER — Ambulatory Visit: Payer: Self-pay | Admitting: Gastroenterology

## 2024-12-03 ENCOUNTER — Telehealth: Payer: Self-pay | Admitting: Gastroenterology

## 2024-12-03 NOTE — Telephone Encounter (Signed)
 Call from pt regarding rx pantoprazole . Pt stated she is taking olaparib and is sure that the two medications are causing diarrhea. Pt stated she recently stopped her pantoprazole .  Pt requesting medical advice. Please advise. Thank you

## 2024-12-03 NOTE — Telephone Encounter (Signed)
 Dr Charlanne pt- Mansouraty only did EUS

## 2024-12-04 ENCOUNTER — Other Ambulatory Visit: Payer: Self-pay

## 2024-12-04 MED ORDER — OMEPRAZOLE 40 MG PO CPDR: 40.0000 mg | DELAYED_RELEASE_CAPSULE | Freq: Two times a day (BID) | ORAL | 2 refills | Status: AC

## 2024-12-04 NOTE — Telephone Encounter (Signed)
 Will try to switch to omeprazole  40 mg twice daily this has less percent of diarrhea than the Protonix , this does not work consider Aciphex this has less reported diarrhea. Patient deftly needs twice daily for gastric ulcers we could also potentially consider doing once daily PPI Pepcid in the evening and Carafate for 10 days which can cause constipation. Try the omeprazole  40 mg twice daily if this causes diarrhea can consider once daily with Carafate.

## 2024-12-04 NOTE — Telephone Encounter (Signed)
 Pt made aware of PA recommendations. She's been advised to call us  back with an update if medication is causing diarrhea again or if medication is not affordable.

## 2024-12-04 NOTE — Telephone Encounter (Signed)
 Returned call to patient & she currenlty takes Lynparza for breast cancer daily and since starting pantoprazole  at time of EUS diarrhea has worsened. She previously had diarrhea occasionally with the chemo drug, but it worsened with pantoprazole . She discontinued pantoprazole  last weekend & symptoms have improved. Pt would like an affordable alternative with less side effects if possible.

## 2024-12-10 ENCOUNTER — Telehealth: Payer: Self-pay

## 2024-12-10 NOTE — Telephone Encounter (Signed)
 PT is calling to update she has now been prescribed Nexium 40mg . She needs to know if she should take it once or twice daily. Please advise.

## 2024-12-10 NOTE — Telephone Encounter (Signed)
 error

## 2024-12-10 NOTE — Telephone Encounter (Signed)
 Pt stated that she was originally prescribed Pantoprazole  in which she could not tolerate it due to increase in diarrhea, the omeprazole  in which she could not take due to drug interactions. Pt PCP prescribed her Nexium once a day. Pt is questioning this and if she needs to be on this twice daily.  Please review and advise.  Please see notes below

## 2024-12-10 NOTE — Telephone Encounter (Signed)
 Patient called in regards to contacting GI office about Nexium 40 mg prescription from PCP office. Per Alan Coombs PA, patient advised to take Nexium twice a day. Patient to contact PCP office that medication is to be BID and have prescription adjusted appropriately. Patient to notify office if diarrhea becomes worse after starting medication for further advice. Patient verbalized understanding and all questions were answered.
